# Patient Record
Sex: Male | Born: 1955 | Race: White | Hispanic: No | Marital: Married | Smoking: Never smoker
Health system: Southern US, Community
[De-identification: ages and names within clinical notes are randomized; demographics above are authoritative.]

## PROBLEM LIST (undated history)

## (undated) DIAGNOSIS — F419 Anxiety disorder, unspecified: Secondary | ICD-10-CM

## (undated) DIAGNOSIS — F329 Major depressive disorder, single episode, unspecified: Secondary | ICD-10-CM

## (undated) DIAGNOSIS — F32A Depression, unspecified: Secondary | ICD-10-CM

## (undated) HISTORY — PX: ACHILLES TENDON REPAIR: SUR1153

## (undated) HISTORY — PX: VASECTOMY: SHX75

---
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
## 2007-01-16 ENCOUNTER — Ambulatory Visit (HOSPITAL_COMMUNITY): Admission: RE | Admit: 2007-01-16 | Discharge: 2007-01-16 | Payer: Self-pay | Admitting: Gastroenterology

## 2009-06-23 ENCOUNTER — Emergency Department (HOSPITAL_COMMUNITY): Admission: EM | Admit: 2009-06-23 | Discharge: 2009-06-23 | Payer: Self-pay | Admitting: Emergency Medicine

## 2010-09-15 NOTE — Op Note (Signed)
NAMEJARAD, BARTH               ACCOUNT NO.:  [id number]   MEDICAL RECORD NO.:  [id number]          PATIENT TYPE:  AMB   LOCATION:  ENDO                         FACILITY[facility]  [facility]   PHYSICIAN:  Shirley Friar, MDDATE OF BIRTH:  01-25-1956   DATE OF PROCEDURE:  01/16/2007  DATE OF DISCHARGE:                               OPERATIVE REPORT   OPERATION/PROCEDURE:  Colonoscopy.   INDICATIONS:  Heme-positive stools.   MEDICATIONS:  Fentanyl 100 mcg IV, Versed 6 mg IV   FINDINGS:  Rectal exam was normal.  A pediatric Pentax colonoscope was  inserted through a well-prepped colon, advanced to the cecum where  ileocecal valve and appendiceal orifice were identified.  On careful  withdrawal of the colonoscope, no mucosal abnormalities were seen.  Retroflexion revealed minimal internal hemorrhoids.  Minimal internal hemorrhoids;  otherwise normal colonoscopy.   PLAN:  Repeat colonoscopy in 5 years.  If the patient has anemia that  persists, then may need to consider further evaluation.      Shirley Friar, MD  Electronically Signed     VCS/MEDQ  D:  01/16/2007  T:  01/17/2007  Job:  [id number]

## 2015-04-14 ENCOUNTER — Ambulatory Visit (INDEPENDENT_AMBULATORY_CARE_PROVIDER_SITE_OTHER): Payer: 59 | Admitting: Sports Medicine

## 2015-04-14 ENCOUNTER — Encounter: Payer: Self-pay | Admitting: Sports Medicine

## 2015-04-14 VITALS — BP 120/74 | Ht 69.0 in | Wt 185.0 lb

## 2015-04-14 DIAGNOSIS — M25512 Pain in left shoulder: Secondary | ICD-10-CM

## 2015-04-14 DIAGNOSIS — M7502 Adhesive capsulitis of left shoulder: Secondary | ICD-10-CM | POA: Diagnosis not present

## 2015-04-14 NOTE — Patient Instructions (Signed)
Adhesive Capsulitis Adhesive capsulitis is inflammation of the tendons and ligaments that surround the shoulder joint (shoulder capsule). This condition causes the shoulder to become stiff and painful to move. Adhesive capsulitis is also called frozen shoulder. CAUSES This condition may be caused by:  An injury to the shoulder joint.  Straining the shoulder.  Not moving the shoulder for a period of time. This can happen if your arm was injured or in a sling.  Long-standing health problems, such as:  Diabetes.  Thyroid problems.  Heart disease.  Stroke.  Rheumatoid arthritis.  Lung disease. In some cases, the cause may not be known. RISK FACTORS This condition is more likely to develop in:  Women.  People who are older than 57 years of age. SYMPTOMS Symptoms of this condition include:  Pain in the shoulder when moving the arm. There may also be pain when parts of the shoulder are touched. The pain is worse at night or when at rest.  Soreness or aching in the shoulder.  Inability to move the shoulder normally.  Muscle spasms. DIAGNOSIS This condition is diagnosed with a physical exam and imaging tests, such as an X-ray or MRI. TREATMENT This condition may be treated with:  Treatment of the underlying cause or condition.  Physical therapy. This involves performing exercises to get the shoulder moving again.  Medicine. Medicine may be given to relieve pain, inflammation, or muscle spasms.  Steroid injections into the shoulder joint.  Shoulder manipulation. This is a procedure to move the shoulder into another position. It is done after you are given a medicine to make you fall asleep (general anesthetic). The joint may also be injected with salt water at high pressure to break down scarring.  Surgery. This may be done in severe cases when other treatments have failed. Although most people recover completely from adhesive capsulitis, some may not regain the full  movement of the shoulder. HOME CARE INSTRUCTIONS  Take over-the-counter and prescription medicines only as told by your health care provider.  If you are being treated with physical therapy, follow instructions from your physical therapist.  Avoid exercises that put a lot of demand on your shoulder, such as throwing. These exercises can make pain worse.  If directed, apply ice to the injured area:  Put ice in a plastic bag.  Place a towel between your skin and the bag.  Leave the ice on for 20 minutes, 2-3 times per day. SEEK MEDICAL CARE IF:  You develop new symptoms.  Your symptoms get worse.   This information is not intended to replace advice given to you by your health care provider. Make sure you discuss any questions you have with your health care provider.   Document Released: 02/14/2009 Document Revised: 01/08/2015 Document Reviewed: 08/12/2014 Elsevier Interactive Patient Education 2016 [employer].  

## 2015-04-14 NOTE — Progress Notes (Signed)
   Subjective:    Patient ID: Benjamin Nichols, male    DOB: February 28, 1956, 59 y.o.   MRN: [id number]  HPI Benjamin Nichols is a 59yo male presenting today for left shoulder pain. - Notes pain since September 2016 - Pain located of left lateral and posterior shoulder - Describes pain as stabbing, ice pick sensation - Notes increased stiffness in the morning - Denies history of trauma or injury - Notes decreased ROM of abduction and external rotation. States this makes it difficult to do certain things, such as put on a coat. - Has tried Advil and Meloxicam without relief. Notes some relief with Biofreeze. - Denies swelling, redness - Right hand dominant  Review of Systems Per HPI    Objective:   Physical Exam General: 59yo male resting comfortably in no apparent distress Cardiac: Upper extremities warm and well perfused Resp: No increased work of breathing noted Musc: Shoulders symmetric bilaterally without edema, no tenderness to palpation noted, passive and active ROM of left shoulder significantly diminished in left shoulder (abduction 70 degrees, internal rotation to L2, external rotation 50 degrees), muscle strength 5/5 in upper extremity with intact rotator cuff     Assessment & Plan:  # Adhesive Capsulitis: Decreased passive and active ROM of left shoulder on exam. Effecting activities of daily living, such as dressing. Oral medications have not been effective. Differential diagnosis includes arthritis of left shoulder.  Plan: - Will obtain AP and axillary views of left shoulder - Follow up Thursday (04/17/15) to discuss xray results. Consider injection under ultrasound guidance at that time.  Patient seen and evaluated with the resident. I agree with the above plan of care. Patient has severely limited range of motion in all planes both actively and passively including passive external rotation. These findings are consistent with adhesive capsulitis. Rotator cuff strength is 5/5 and  does not reproduce pain. I would like for the patient to get some x-rays of his shoulder to rule out glenohumeral osteoarthritis and follow-up with [place] later this week for an ultrasound guided intra-articular injection. I may consider amitriptyline as well.

## 2015-04-15 ENCOUNTER — Ambulatory Visit (HOSPITAL_COMMUNITY)
Admission: RE | Admit: 2015-04-15 | Discharge: 2015-04-15 | Disposition: A | Discharge status: Home / Self Care | Payer: 59 | Source: Ambulatory Visit | Attending: Sports Medicine | Admitting: Sports Medicine

## 2015-04-15 ENCOUNTER — Ambulatory Visit: Payer: Self-pay | Admitting: Sports Medicine

## 2015-04-15 DIAGNOSIS — M7592 Shoulder lesion, unspecified, left shoulder: Secondary | ICD-10-CM | POA: Diagnosis not present

## 2015-04-15 DIAGNOSIS — M25512 Pain in left shoulder: Secondary | ICD-10-CM | POA: Insufficient documentation

## 2015-04-17 ENCOUNTER — Encounter: Payer: Self-pay | Admitting: Sports Medicine

## 2015-04-17 ENCOUNTER — Ambulatory Visit (INDEPENDENT_AMBULATORY_CARE_PROVIDER_SITE_OTHER): Payer: 59 | Admitting: Sports Medicine

## 2015-04-17 VITALS — BP 100/64 | Ht 69.0 in | Wt 185.0 lb

## 2015-04-17 DIAGNOSIS — M7502 Adhesive capsulitis of left shoulder: Secondary | ICD-10-CM | POA: Diagnosis not present

## 2015-04-17 DIAGNOSIS — M25512 Pain in left shoulder: Secondary | ICD-10-CM | POA: Diagnosis not present

## 2015-04-17 MED ORDER — METHYLPREDNISOLONE ACETATE 40 MG/ML IJ SUSP
40.0000 mg | Freq: Once | INTRAMUSCULAR | Status: AC
  Administered 2015-04-17: 40 mg via INTRA_ARTICULAR

## 2015-04-17 NOTE — Assessment & Plan Note (Signed)
Most likely component of adhesive capsulitis of his left shoulder. -Intra-articular distention/injection today and we'll refer to physical therapy. -Follow-up in 6-8 weeks to see how he is doing at that time.   Aspiration/Injection Procedure Note ASHKAN ANNUNZIATO 04-Oct-1955  Procedure: Injection Indications: Adhesive capsulitis   Procedure Details Consent: Risks of procedure as well as the alternatives and risks of each were explained to the (patient/caregiver).  Consent for procedure obtained. Time Out: Verified patient identification, verified procedure, site/side was marked, verified correct patient position, special equipment/implants available, medications/allergies/relevent history reviewed, required imaging and test results available.  Performed.  The area was cleaned with iodine and alcohol swabs.    The L intraarticular shoulder joint was injected using 2 cc's of 40mg  Depomedrol and 8 cc's of 1% lidocaine with a 21 1 1/2" needle.  Ultrasound was used. Images were obtained in Transverse and Long views showing the injection.    A sterile dressing was applied.  Patient did tolerate procedure well. Estimated blood loss: None

## 2015-04-17 NOTE — Progress Notes (Signed)
  Benjamin Nichols - 59 y.o. male MRN [id number]  Date of birth: May 21, 1955  SUBJECTIVE:  Including CC & ROS.  Benjamin Nichols is a 59 y.o. male who presents today for left shoulder pain.    Shoulder Pain left, initial visit - patient presents today for ongoing left shoulder pain for the past 6-7 months now. Pain was initially severe and limited his range of motion and function at that time. It has progressed to the point that now his range of motion is the only thing that is limited especially in abduction and external rotation. He does notice pain with occasionally picking up objects. He denies any paresthesias going down either arm and he is right hand dominant. No previous injury to either shoulder. He has tried NSAIDs and ice with minimal relief to date. It does awaken him at night and does not notice it overhead motion really limits his activity.    PMHx - Updated and reviewed.  Contributory factors include: Noncontributory PSHx - Updated and reviewed.  Contributory factors include:  Noncontributory FHx - Updated and reviewed.  Contributory factors include:  Noncontributory Medications - updated and reviewed   ROS Per HPI   PE: Filed Vitals:   04/17/15 0903  BP: 100/64   Gen: NAD Cardiorespiratory - Normal respiratory effort/rate.  RRR  MSK Shoulder:  L Shoulder: Inspection reveals no abnormalities, atrophy or asymmetry. Palpation is normal with no tenderness over AC joint or bicipital groove. ROM - 150 degrees flexion, 60 degrees extension, 100 abduction, 60 ER/ 90 IR  Rotator cuff strength normal throughout. No signs of impingement with negative Neer and Hawkin's tests, empty can. Speeds and Yergason's tests normal. No labral pathology noted with negative Obrien's, negative crank, negative anterior glide, negative compression/rotation and good stability. Normal scapular function observed. No painful arc and no drop arm sign. No apprehension sign and no Jobe relocation sign    Negative Cross arm maneuver against resistance   Neurovascular status - Intact B/L UE

## 2015-05-06 ENCOUNTER — Ambulatory Visit: Payer: 59 | Attending: Sports Medicine | Admitting: Physical Therapy

## 2015-05-06 DIAGNOSIS — M6281 Muscle weakness (generalized): Secondary | ICD-10-CM | POA: Insufficient documentation

## 2015-05-06 DIAGNOSIS — R531 Weakness: Secondary | ICD-10-CM

## 2015-05-06 DIAGNOSIS — M7582 Other shoulder lesions, left shoulder: Secondary | ICD-10-CM | POA: Diagnosis not present

## 2015-05-06 DIAGNOSIS — M25612 Stiffness of left shoulder, not elsewhere classified: Secondary | ICD-10-CM

## 2015-05-06 DIAGNOSIS — M25512 Pain in left shoulder: Secondary | ICD-10-CM | POA: Diagnosis not present

## 2015-05-06 NOTE — Therapy (Signed)
[place], [place], [address] Phone: [phone]   Fax:  [phone]  Physical Therapy Evaluation  Patient Details  Name: Benjamin Nichols MRN: [id number] Date of Birth: 1955-08-06 Referring Provider: Thurman Coyer, DO  Encounter Date: 05/06/2015      PT End of Session - 05/06/15 1355    Visit Number 1   Number of Visits 8   Date for PT Re-Evaluation 07/01/15   PT Start Time 0936   PT Stop Time 1025   PT Time Calculation (min) 49 min   Activity Tolerance Patient tolerated treatment well   Behavior During Therapy [facility] for tasks assessed/performed      History reviewed. No pertinent past medical history.  History reviewed. No pertinent past surgical history.  There were no vitals filed for this visit.  Visit Diagnosis:  Decreased range of motion of shoulder, left - Plan: PT plan of care cert/re-cert  Decreased strength - Plan: PT plan of care cert/re-cert  Pain in joint of left shoulder - Plan: PT plan of care cert/re-cert      Subjective Assessment - 05/06/15 0942    Subjective Noticed increased pain and decreased range of motion in the Lt shoulder in September and gradual progressed. Diagnosed with adhesive capsulitis and referred to OPPT. Did have cortisone injection 2 weeks ago.    Limitations House hold activities;Lifting   How long can you sit comfortably? unlimited   How long can you stand comfortably? unlimited    How long can you walk comfortably? unlimited   Patient Stated Goals Be able to move are more and have less pain.    Currently in Pain? No/denies  no pain while at rest, will increase with motion and stretching            [facility] PT Assessment - 05/06/15 0001    Assessment   Medical Diagnosis Left shoulder pain   Referring Provider Thurman Coyer, DO   Onset Date/Surgical Date --  September   Next MD Visit about a month from now   Prior Therapy none   Precautions   Precautions None   Restrictions   Weight Bearing Restrictions No   Balance Screen   Has the patient fallen in the past 6 months No   [place] residence   Living Arrangements Spouse/significant other   Prior Function   Level of Independence Independent   Vocation Full time employment   Vocation Requirements sitting and computer time, able to get up walking around   Cognition   Overall Cognitive Status Within Functional Limits for tasks assessed   Observation/Other Assessments   Focus on Therapeutic Outcomes (FOTO)  39% limitation   Sensation   Light Touch Appears Intact   Posture/Postural Control   Posture/Postural Control No significant limitations   AROM   Right Shoulder Flexion 160 Degrees   Right Shoulder Internal Rotation --  mid thoracic   Right Shoulder External Rotation 69 Degrees   Left Shoulder Flexion 124 Degrees   Left Shoulder Internal Rotation --  gluteal   Left Shoulder External Rotation 26 Degrees   Strength   Left Shoulder Flexion 4+/5   Left Shoulder Internal Rotation 4+/5   Left Shoulder External Rotation 4+/5                           PT Education - 05/06/15 1355    Education provided Yes  Education Details HEP, adhesive capsulitis anatomy   Person(s) Educated Patient   Methods Explanation;Demonstration;Tactile cues;Verbal cues;Handout   Comprehension Verbalized understanding;Returned demonstration          PT Short Term Goals - 05/06/15 1407    PT SHORT TERM GOAL #1   Title Patient is to be independent with a HEP for initial stretches.    Time 4   Period Weeks   Status New   PT SHORT TERM GOAL #2   Title Patient is to demo 140 degrees of Lt shoulder flexion for reaching tasks.    Time 4   Period Weeks   Status New   PT SHORT TERM GOAL #3   Title Patient is to demo Lt shoulder IR to lumbar level for donning a coat.    Time 4   Period Weeks   Status New           PT Long  Term Goals - 05/06/15 1408    PT LONG TERM GOAL #1   Title Patient is to be independent with a HEP for ROM and strengthening exercises.    Time 8   Period Weeks   Status New   PT LONG TERM GOAL #2   Title Patient is to demon greater to or equal to 160 degrees of Lt shoulder flexion for reaching to top shelves.    Time 8   Period Weeks   Status New   PT LONG TERM GOAL #3   Title Patient is to have internal rotation to low throacic level for dressing.    Time 8   Period Weeks   Status New   PT LONG TERM GOAL #4   Title Patient to rate his pain as less than 3/10 with activity for increased activity tolerance.    Time 8   Period Weeks   Status New   PT LONG TERM GOAL #5   Title Patient is to demo 5/5 strenght with lt shoulder flexion for lifting tasks.    Time 8   Period Weeks               Plan - 05/06/15 1358    Clinical Impression Statement Patient is a 60 y.o. male who is referred to OPPT with a diagnosis of Lt shoulder pain. He describes that he has been diagnosed with adhesive capsulitis and received an injection which has seemed to help. At this time the patient is appropriate for ongoing PT sessions to address deficits in ROM and strength through the Lt  UE. The plan was discussed and he agrees to continue. The patient did request starting with 1Xweek at this time.    Pt will benefit from skilled therapeutic intervention in order to improve on the following deficits Decreased strength;Decreased activity tolerance;Impaired flexibility;Decreased range of motion;Pain   PT Frequency 1x / week   PT Duration 8 weeks   PT Treatment/Interventions ADLs/Self Care Home Management;Cryotherapy;Electrical Stimulation;Iontophoresis 4mg /ml Dexamethasone;Moist Heat;Ultrasound;Patient/family education;Therapeutic exercise;Therapeutic activities;Manual techniques;Passive range of motion;Dry needling;Taping   PT Next Visit Plan joint mobilizations and endrange stretches for Lt shoulder.     PT Home Exercise Plan review stretches, add external rotations stretch and modify current HEP as needed.    Consulted and Agree with Plan of Care Patient         Problem List Patient Active Problem List   Diagnosis Date Noted  . Adhesive capsulitis of left shoulder 04/17/2015    Linard Millers, PT 05/06/2015, 2:17 PM  [facility] [address]  [place], [place], [address] Phone: [phone]   Fax:  [phone]  Name: Benjamin Nichols MRN: [id number] Date of Birth: 1955-11-29

## 2015-05-13 ENCOUNTER — Ambulatory Visit: Payer: 59 | Admitting: Physical Therapy

## 2015-05-13 DIAGNOSIS — M6281 Muscle weakness (generalized): Secondary | ICD-10-CM | POA: Diagnosis not present

## 2015-05-13 DIAGNOSIS — M7582 Other shoulder lesions, left shoulder: Secondary | ICD-10-CM | POA: Diagnosis not present

## 2015-05-13 DIAGNOSIS — M25612 Stiffness of left shoulder, not elsewhere classified: Secondary | ICD-10-CM

## 2015-05-13 DIAGNOSIS — M25512 Pain in left shoulder: Secondary | ICD-10-CM | POA: Diagnosis not present

## 2015-05-13 DIAGNOSIS — R531 Weakness: Secondary | ICD-10-CM

## 2015-05-13 NOTE — Patient Instructions (Signed)
Pectoral Stretch    With arms behind doorjamb, gently lean forward. Stretch is felt across chest. Hold 30___ seconds. Repeat _3__ times. Do _1__ sessions per day. http://gt2.exer.us/32   Copyright  VHI. All rights reserved.  Axial Extension (Chin Tuck)    Pull chin in and lengthen back of neck. Hold __5_ seconds while counting out loud. Repeat ___5_ times. Do __PRN__ sessions per day.  http://gt2.exer.us/449   Copyright  VHI. All rights reserved.  Shoulder (Scapula) Retraction    Pull shoulders back, squeezing shoulder blades together. Repeat ____ times per session. Do ____ sessions per week. Position: Standing   Copyright  VHI. All rights reserved.

## 2015-05-13 NOTE — Therapy (Signed)
[place], [place], [address] Phone: [phone]   Fax:  [phone]  Physical Therapy Treatment  Patient Details  Name: THAI BURGUENO MRN: [id number] Date of Birth: 1955/11/17 Referring Provider: Thurman Coyer, DO  Encounter Date: 05/13/2015      PT End of Session - 05/13/15 1358    Visit Number 2   Number of Visits 8   Date for PT Re-Evaluation 07/01/15   PT Start Time 0930   PT Stop Time 1015   PT Time Calculation (min) 45 min   Activity Tolerance Patient tolerated treatment well   Behavior During Therapy [facility] for tasks assessed/performed      No past medical history on file.  No past surgical history on file.  There were no vitals filed for this visit.  Visit Diagnosis:  Decreased range of motion of shoulder, left  Decreased strength  Pain in joint of left shoulder      Subjective Assessment - 05/13/15 0935    Subjective (p) Stiff first thin in am.     Currently in Pain? (p) Yes   Pain Score (p) 9    Pain Orientation (p) Left                         [facility] Adult PT Treatment/Exercise - 05/13/15 0940    Self-Care   Self-Care --  education posture sitting handout issued, practiced   Neck Exercises: Seated   Neck Retraction 5 reps;5 secs  added to home   Shoulder Exercises: Supine   External Rotation 5 reps  fists on forehead, with joint distraction.  mobilization.     Flexion Limitations AAROM 5 Xfingers laced.   Other Supine Exercises serratus punches.  10 X   Shoulder Exercises: Seated   External Rotation AROM;10 reps  for posture stretch intermittant   Shoulder Exercises: Prone   Extension 10 reps  scapular movement first then moves arm into extension   Other Prone Exercises row: scapular motions first  then arm motion into row.     Shoulder Exercises: Standing   External Rotation 10 reps   Theraband Level (Shoulder External Rotation) Level 3 (Green)   added to home   Internal Rotation 10 reps   Theraband Level (Shoulder Internal Rotation) Level 3 (Green)   Row 10 reps   Theraband Level (Shoulder Row) Level 3 (Green)  added to home   Shoulder Exercises: IT sales professional 3 reps;30 seconds   Corner Stretch Limitations Doorway, added to home   Other Shoulder Stretches sleeper stretch practice 1 X correct technique used   Other Shoulder Stretches IR self mobilization with towel roll 5 X 10 seconds.  Cues   Manual Therapy   Manual therapy comments joint mobilization inferior capsule,  posterior , teres non tender                PT Education - 05/13/15 1357    Education provided Yes   Education Details exercise, posture   Person(s) Educated Patient   Methods Explanation;Demonstration;Tactile cues;Verbal cues;Handout   Comprehension Verbalized understanding;Returned demonstration          PT Short Term Goals - 05/13/15 1401    PT SHORT TERM GOAL #1   Title Patient is to be independent with a HEP for initial stretches.    Baseline cues   Time 4   Period Weeks   Status On-going   PT SHORT TERM GOAL #  2   Title Patient is to demo 140 degrees of Lt shoulder flexion for reaching tasks.    Time 4   Period Weeks   Status On-going   PT SHORT TERM GOAL #3   Title Patient is to demo Lt shoulder IR to lumbar level for donning a coat.    Time 4   Period Weeks   Status Achieved           PT Long Term Goals - 05/13/15 1403    PT LONG TERM GOAL #1   Title Patient is to be independent with a HEP for ROM and strengthening exercises.    Time 8   Period Weeks   Status On-going   PT LONG TERM GOAL #2   Title Patient is to demon greater to or equal to 160 degrees of Lt shoulder flexion for reaching to top shelves.    Time 8   Period Weeks   Status On-going   PT LONG TERM GOAL #3   Title Patient is to have internal rotation to low throacic level for dressing.    Time 8   Period Weeks   Status On-going   PT LONG  TERM GOAL #4   Title Patient to rate his pain as less than 3/10 with activity for increased activity tolerance.    Baseline 3/10   Time 8   Period Weeks   Status Achieved   PT LONG TERM GOAL #5   Title Patient is to demo 5/5 strenght with lt shoulder flexion for lifting tasks.    Time 8   Period Weeks   Status On-going               Plan - 05/13/15 1358    Clinical Impression Statement Progress toward range and pain goals.  He has decided to return in 2 weeks for re check and exercise progression vs making today be his last day.  Progress toward exercise goal ..STG # 3 met.   PT Next Visit Plan joint mobilizations and endrange stretches for Lt shoulder.    PT Home Exercise Plan row, neck retraction, IR/ER/ bands  and active, doorway stretch   Consulted and Agree with Plan of Care Patient        Problem List Patient Active Problem List   Diagnosis Date Noted  . Adhesive capsulitis of left shoulder 04/17/2015    [facility] 05/13/2015, 2:04 PM  [facility] [address] [place], [place], [address] Phone: [phone]   Fax:  [phone]  Name: Benjamin Nichols MRN: [id number] Date of Birth: Nov 17, 1955    Melvenia Needles, PTA 05/13/2015 2:04 PM Phone: [phone] Fax: [phone]

## 2015-05-20 ENCOUNTER — Ambulatory Visit (INDEPENDENT_AMBULATORY_CARE_PROVIDER_SITE_OTHER): Payer: 59 | Admitting: Sports Medicine

## 2015-05-20 ENCOUNTER — Encounter: Payer: Self-pay | Admitting: Sports Medicine

## 2015-05-20 VITALS — BP 108/68 | Ht 69.0 in | Wt 185.0 lb

## 2015-05-20 DIAGNOSIS — M7502 Adhesive capsulitis of left shoulder: Secondary | ICD-10-CM

## 2015-05-20 NOTE — Progress Notes (Signed)
   Subjective:    Patient ID: Benjamin Nichols, male    DOB: 04/09/1956, 60 y.o.   MRN: [id number]  HPI   Patient comes in today for follow-up on left shoulder adhesive capsulitis. He is doing well. He feels like he has made about 50% improvement since his last office visit. He underwent a combination subacromial/intra-articular cortisone injection, which was ultrasound-guided, one month ago. He has been working in physical therapy since then. Pain has improved but not completely resolved. Range of motion is also improving but has not returned to normal. He is taking 2 Advil twice a day but otherwise is not taking any other sort of pain medication.    Review of Systems     Objective:   Physical Exam Well-developed, well-nourished. No acute distress  Left shoulder: Active forward flexion is 130. Active abduction is 110 degrees. Active internal rotation is 70. Active external rotation is 60. Passive range of motion is identical to active range of motion. Slight pain with active and passive range of motion but no significant cuff weakness. Neurovascularly intact distally.       Assessment & Plan:  Improving left shoulder pain secondary to adhesive capsulitis/subacromial bursitis/rotator cuff impingement  Patient is making good progress. I've encouraged him to continue working with formal physical therapy and follow-up with me in another 4 weeks. As long as he continues to progress, I think I can hold on further diagnostic imaging. However, if his symptoms plateau or worsen then we could consider a possible MRI or referral to Dr. Mardelle Matte.

## 2015-05-22 MED FILL — CIALIS 5 MG TABLET: 5 | 30 days supply | Qty: 30 | Fill #2

## 2015-05-27 ENCOUNTER — Ambulatory Visit: Payer: 59 | Admitting: Physical Therapy

## 2015-05-27 DIAGNOSIS — M25612 Stiffness of left shoulder, not elsewhere classified: Secondary | ICD-10-CM

## 2015-05-27 DIAGNOSIS — R531 Weakness: Secondary | ICD-10-CM

## 2015-05-27 DIAGNOSIS — M25512 Pain in left shoulder: Secondary | ICD-10-CM

## 2015-05-27 DIAGNOSIS — M6281 Muscle weakness (generalized): Secondary | ICD-10-CM | POA: Diagnosis not present

## 2015-05-27 DIAGNOSIS — M7582 Other shoulder lesions, left shoulder: Secondary | ICD-10-CM | POA: Diagnosis not present

## 2015-05-28 NOTE — Therapy (Signed)
[place], [place], [address] Phone: [phone]   Fax:  [phone]  Physical Therapy Treatment  Patient Details  Name: Benjamin Nichols MRN: [id number] Date of Birth: [birth year] Referring Provider: Thurman Coyer, DO  Encounter Date: 05/27/2015      PT End of Session - 05/27/15 1458    Visit Number 3   Number of Visits 8   Date for PT Re-Evaluation 07/01/15    PT start time: 6440 PT end time" : 0930 45 minutes Tolerated treatment well   No past medical history on file.  No past surgical history on file.  There were no vitals filed for this visit.  Visit Diagnosis:  Decreased range of motion of shoulder, left  Decreased strength  Pain in joint of left shoulder                       [facility] Adult PT Treatment/Exercise - 05/27/15 0921    Shoulder Exercises: Sidelying   External Rotation 10 reps  2 sets 0 LBS, with towel roll.   Flexion 10 reps  UE ranger used   ABduction 10 reps   Shoulder Exercises: Standing   Other Standing Exercises UE ranger for IR 10 X  Other exercises doorway er stretch 3 X 30    Manual Therapy   Manual Therapy Joint mobilization;Soft tissue mobilization;Myofascial release;Scapular mobilization;Passive ROM   Manual therapy comments grade 11 joint mobilization, myofascial release LT     Plan.  No pain post session.  Shoulder feels looser with increased range.  Manual helpful.  No new goals met Next visit: Measure,               PT Short Term Goals - 05/13/15 1401    PT SHORT TERM GOAL #1   Title Patient is to be independent with a HEP for initial stretches.    Baseline cues   Time 4   Period Weeks   Status On-going   PT SHORT TERM GOAL #2   Title Patient is to demo 140 degrees of Lt shoulder flexion for reaching tasks.    Time 4   Period Weeks   Status On-going   PT SHORT TERM GOAL #3   Title Patient is to demo Lt shoulder IR to  lumbar level for donning a coat.    Time 4   Period Weeks   Status Achieved           PT Long Term Goals - 05/13/15 1403    PT LONG TERM GOAL #1   Title Patient is to be independent with a HEP for ROM and strengthening exercises.    Time 8   Period Weeks   Status On-going   PT LONG TERM GOAL #2   Title Patient is to demon greater to or equal to 160 degrees of Lt shoulder flexion for reaching to top shelves.    Time 8   Period Weeks   Status On-going   PT LONG TERM GOAL #3   Title Patient is to have internal rotation to low throacic level for dressing.    Time 8   Period Weeks   Status On-going   PT LONG TERM GOAL #4   Title Patient to rate his pain as less than 3/10 with activity for increased activity tolerance.    Baseline 3/10   Time 8   Period Weeks   Status Achieved   PT LONG TERM GOAL #5  Title Patient is to demo 5/5 strenght with lt shoulder flexion for lifting tasks.    Time 8   Period Weeks   Status On-going               Problem List Patient Active Problem List   Diagnosis Date Noted  . Adhesive capsulitis of left shoulder 04/17/2015    [facility] 05/28/2015, 1:00 PM  [facility] [address] [place], [place], [address] Phone: [phone]   Fax:  [phone]  Name: ALAZAR CHERIAN MRN: [id number] Date of Birth: March 15, 1956    Melvenia Needles, PTA 05/28/2015 1:00 PM Phone: [phone] Fax: [phone]

## 2015-06-03 ENCOUNTER — Ambulatory Visit: Payer: 59 | Admitting: Physical Therapy

## 2015-06-03 DIAGNOSIS — R531 Weakness: Secondary | ICD-10-CM

## 2015-06-03 DIAGNOSIS — M25612 Stiffness of left shoulder, not elsewhere classified: Secondary | ICD-10-CM

## 2015-06-03 DIAGNOSIS — M25512 Pain in left shoulder: Secondary | ICD-10-CM

## 2015-06-03 DIAGNOSIS — M6281 Muscle weakness (generalized): Secondary | ICD-10-CM | POA: Diagnosis not present

## 2015-06-03 DIAGNOSIS — M7582 Other shoulder lesions, left shoulder: Secondary | ICD-10-CM | POA: Diagnosis not present

## 2015-06-03 NOTE — Therapy (Signed)
[place], [place], [address] Phone: [phone]   Fax:  [phone]  Physical Therapy Treatment  Patient Details  Name: Benjamin Nichols MRN: [id number] Date of Birth: 11/02/1955 Referring Provider: Thurman Coyer, DO  Encounter Date: 06/03/2015      PT End of Session - 06/03/15 1039    Visit Number 4   Number of Visits 8   Date for PT Re-Evaluation 07/01/15   PT Start Time 0845   PT Stop Time 0930   PT Time Calculation (min) 45 min      No past medical history on file.  No past surgical history on file.  There were no vitals filed for this visit.  Visit Diagnosis:  Decreased range of motion of shoulder, left  Decreased strength  Pain in joint of left shoulder      Subjective Assessment - 06/03/15 0849    Subjective No pain. Only twinge with end range- have to push to make hurt.    Currently in Pain? No/denies            [facility] PT Assessment - 06/03/15 0855    AROM   Left Shoulder Flexion 145 Degrees   Left Shoulder Internal Rotation --  Lumbar 1   Left Shoulder External Rotation 34 Degrees   Strength   Left Shoulder Flexion 4+/5                     [facility] Adult PT Treatment/Exercise - 06/03/15 0001    Shoulder Exercises: Standing   Other Standing Exercises UE ranger for IR  25- able to reach thoracic spine after   Shoulder Exercises: Stretch   Corner Stretch 3 reps;30 seconds   Corner Stretch Limitations Doorway, added to home   Wall Stretch - Flexion 2 reps;30 seconds   Wall Stretch - Flexion Limitations then abduction 2 reps 30 secs   Other Shoulder Stretches supine pec stretch    Other Shoulder Stretches IR AAROM stretch 3 x 30 sec   Manual Therapy   Manual Therapy Passive ROM   Manual therapy comments grade 2,3 inferior glides and AP glides as well as prone PA glides   Passive ROM Followed by PROM flex abdct, ER, IR                  PT Short  Term Goals - 06/03/15 0850    PT SHORT TERM GOAL #1   Title Patient is to be independent with a HEP for initial stretches.    Time 4   Period Weeks   Status Achieved   PT SHORT TERM GOAL #2   Title Patient is to demo 140 degrees of Lt shoulder flexion for reaching tasks.    Time 4   Period Weeks   Status Achieved   PT SHORT TERM GOAL #3   Title Patient is to demo Lt shoulder IR to lumbar level for donning a coat.    Time 4   Period Weeks   Status Achieved           PT Long Term Goals - 06/03/15 9357    PT LONG TERM GOAL #1   Title Patient is to be independent with a HEP for ROM and strengthening exercises.    Time 8   Period Weeks   Status On-going   PT LONG TERM GOAL #2   Title Patient is to demon greater to or equal to 160 degrees of Lt shoulder  flexion for reaching to top shelves.    Time 8   Period Weeks   Status On-going   PT LONG TERM GOAL #3   Title Patient is to have internal rotation to low throacic level for dressing.    Time 8   Period Weeks   Status Partially Met   PT LONG TERM GOAL #4   Title Patient to rate his pain as less than 3/10 with activity for increased activity tolerance.    Time 8   Period Weeks   Status Achieved   PT LONG TERM GOAL #5   Title Patient is to demo 5/5 strenght with lt shoulder flexion for lifting tasks.    Time 8   Period Weeks   Status On-going               Plan - 06/03/15 1040    Clinical Impression Statement AROM improved therefore all STGs achieved. Pt reports significant improvement in pain and it requires alot of effort to reproduce his pain. Worked toward LTGs using joint mobilizations followed by PROM, AAROM and stretching exercises. Will assess ROM next visit and discuss renewal verses discharge to HEP for continued recovery of ROM. See goals met.    PT Next Visit Plan joint mobilizations and endrange stretches for Lt shoulder.         Problem List Patient Active Problem List   Diagnosis Date Noted   . Adhesive capsulitis of left shoulder 04/17/2015    Dorene Ar, PTA 06/03/2015, 10:45 AM  [facility] [address] [place], [place], [address] Phone: [phone]   Fax:  [phone]  Name: Benjamin Nichols MRN: [id number] Date of Birth: 11/02/1955

## 2015-06-10 ENCOUNTER — Ambulatory Visit: Payer: 59 | Attending: Sports Medicine | Admitting: Physical Therapy

## 2015-06-10 DIAGNOSIS — M25512 Pain in left shoulder: Secondary | ICD-10-CM | POA: Insufficient documentation

## 2015-06-10 DIAGNOSIS — R531 Weakness: Secondary | ICD-10-CM

## 2015-06-10 DIAGNOSIS — M25612 Stiffness of left shoulder, not elsewhere classified: Secondary | ICD-10-CM

## 2015-06-10 DIAGNOSIS — M7582 Other shoulder lesions, left shoulder: Secondary | ICD-10-CM | POA: Diagnosis not present

## 2015-06-10 DIAGNOSIS — M6281 Muscle weakness (generalized): Secondary | ICD-10-CM | POA: Insufficient documentation

## 2015-06-10 NOTE — Patient Instructions (Signed)
   Kristoffer Leamon PT, DPT, LAT, ATC  [facility] Phone: [phone]     

## 2015-06-10 NOTE — Therapy (Signed)
[place], [place], [address] Phone: [phone]   Fax:  [phone]  Physical Therapy Treatment  Patient Details  Name: Benjamin Nichols MRN: [id number] Date of Birth: May 27, 1955 Referring Provider: Thurman Coyer, DO  Encounter Date: 06/10/2015      PT End of Session - 06/10/15 1015    Visit Number 5   Number of Visits 8   Date for PT Re-Evaluation 07/01/15   PT Start Time 0845   PT Stop Time 0932   PT Time Calculation (min) 47 min   Activity Tolerance Patient tolerated treatment well   Behavior During Therapy [facility] for tasks assessed/performed      No past medical history on file.  No past surgical history on file.  There were no vitals filed for this visit.  Visit Diagnosis:  Decreased range of motion of shoulder, left  Decreased strength  Pain in joint of left shoulder      Subjective Assessment - 06/10/15 0848    Subjective "no usual pain, I can only make it hurt hwen I stretch or move the right way. it is improving"   Currently in Pain? Yes   Pain Score 1    Pain Location Shoulder   Pain Orientation Left   Pain Type Chronic pain   Aggravating Factors  reaching the wrong way            [facility] PT Assessment - 06/10/15 0001    ROM / Strength   AROM / PROM / Strength AROM   AROM   AROM Assessment Site Shoulder   Right/Left Shoulder Left   Left Shoulder Flexion 135 Degrees  150   Left Shoulder ABduction 105 Degrees  130                     [facility] Adult PT Treatment/Exercise - 06/10/15 0854    Shoulder Exercises: Supine   Other Supine Exercises thoracic extension over bolster with hand behind head 2 x 15   Shoulder Exercises: Standing   Row 10 reps   Theraband Level (Shoulder Row) Level 3 (Green)   Other Standing Exercises UE ranger for IR     Shoulder Exercises: ROM/Strengthening   UBE (Upper Arm Bike) L 1.5 x 6 min  changing direction at 3 min   Shoulder  Exercises: Stretch   Corner Stretch 3 reps;30 seconds   Corner Stretch Limitations laying parallel to bolster letting arms hang at 90 degrees and at 145 degrees  with slight overpressure    Other Shoulder Stretches rhomboid stretch 4 x 30 sec   Manual Therapy   Manual therapy comments grade 3 scapular mobs superior / inferior with upward/ downward rotation, Prone anterior humeral mobs grade 2-3 to facilitate ER                PT Education - 06/10/15 1015    Education provided Yes   Education Details updated HEP   Person(s) Educated Patient   Methods Explanation   Comprehension Verbalized understanding          PT Short Term Goals - 06/03/15 0850    PT SHORT TERM GOAL #1   Title Patient is to be independent with a HEP for initial stretches.    Time 4   Period Weeks   Status Achieved   PT SHORT TERM GOAL #2   Title Patient is to demo 140 degrees of Lt shoulder flexion for reaching tasks.    Time  4   Period Weeks   Status Achieved   PT SHORT TERM GOAL #3   Title Patient is to demo Lt shoulder IR to lumbar level for donning a coat.    Time 4   Period Weeks   Status Achieved           PT Long Term Goals - 06/10/15 1019    PT LONG TERM GOAL #1   Title Patient is to be independent with a HEP for ROM and strengthening exercises.    Time 8   Period Weeks   Status On-going   PT LONG TERM GOAL #2   Title Patient is to demon greater to or equal to 160 degrees of Lt shoulder flexion for reaching to top shelves.    Time 8   Period Weeks   Status On-going   PT LONG TERM GOAL #3   Title Patient is to have internal rotation to low throacic level for dressing.    Time 8   Period Weeks   Status Partially Met   PT LONG TERM GOAL #4   Title Patient to rate his pain as less than 3/10 with activity for increased activity tolerance.    Time 8   Period Weeks   Status Achieved   PT LONG TERM GOAL #5   Title Patient is to demo 5/5 strenght with lt shoulder flexion for  lifting tasks.    Time 8   Period Weeks   Status On-going               Plan - 06/10/15 1016    Clinical Impression Statement Benjamin Nichols reports still having some limitations with ERP with ER and abduction/ flexion. Focused mobs on scapular mobility with superior/inferior motions with upward/downward mobility which improved flexion and abduction. He was able to peform given exercises without complaint of pain and rpeorted that external rotation was easier to get without as much soreness.    PT Next Visit Plan joint mobilizations and endrange stretches for Lt shoulder.    PT Home Exercise Plan supine pec stretch on roll, rhomboid stretching, prone ER with arm haing of table.    Consulted and Agree with Plan of Care Patient        Problem List Patient Active Problem List   Diagnosis Date Noted  . Adhesive capsulitis of left shoulder 04/17/2015   Starr Lake PT, DPT, LAT, ATC  06/10/2015  10:20 AM     [facility] [address] [place], [place], [address] Phone: [phone]   Fax:  [phone]  Name: Benjamin Nichols MRN: [id number] Date of Birth: 10/30/1955

## 2015-06-17 ENCOUNTER — Ambulatory Visit: Payer: 59 | Admitting: Physical Therapy

## 2015-06-17 DIAGNOSIS — M25512 Pain in left shoulder: Secondary | ICD-10-CM | POA: Diagnosis not present

## 2015-06-17 DIAGNOSIS — R531 Weakness: Secondary | ICD-10-CM

## 2015-06-17 DIAGNOSIS — M6281 Muscle weakness (generalized): Secondary | ICD-10-CM | POA: Diagnosis not present

## 2015-06-17 DIAGNOSIS — M7582 Other shoulder lesions, left shoulder: Secondary | ICD-10-CM | POA: Diagnosis not present

## 2015-06-17 DIAGNOSIS — M25612 Stiffness of left shoulder, not elsewhere classified: Secondary | ICD-10-CM

## 2015-06-17 NOTE — Therapy (Signed)
[place], [place], [address] Phone: [phone]   Fax:  [phone]  Physical Therapy Treatment  Patient Details  Name: Benjamin Nichols MRN: [id number] Date of Birth: 1955/12/03 Referring Provider: Thurman Coyer, DO  Encounter Date: 06/17/2015      PT End of Session - 06/17/15 0849    Visit Number 6   Number of Visits 8   Date for PT Re-Evaluation 07/01/15   PT Start Time 0847   PT Stop Time 0935   PT Time Calculation (min) 48 min   Activity Tolerance Patient tolerated treatment well   Behavior During Therapy [facility] for tasks assessed/performed      No past medical history on file.  No past surgical history on file.  There were no vitals filed for this visit.  Visit Diagnosis:  Decreased range of motion of shoulder, left  Decreased strength  Pain in joint of left shoulder      Subjective Assessment - 06/17/15 0848    Subjective No pain.    Currently in Pain? No/denies   Aggravating Factors  stretch to the end point    Pain Relieving Factors change of position.             [facility] PT Assessment - 06/17/15 0001    AROM   Left Shoulder Flexion 138 Degrees  148 after TPDN   Left Shoulder ABduction 148 Degrees   Left Shoulder Internal Rotation --  Lumbar 1                     [facility] Adult PT Treatment/Exercise - 06/17/15 0001    Shoulder Exercises: ROM/Strengthening   UBE (Upper Arm Bike) L 2.0 x 6 min  changing direction at 3 min   Shoulder Exercises: Stretch   Corner Stretch 3 reps;30 seconds   Corner Stretch Limitations Doorway, added to home   Other Shoulder Stretches rhomboid stretch 4 x 30 sec   Manual Therapy   Manual Therapy Soft tissue mobilization   Manual therapy comments Prone anterior humeral mobs grade 2-3 to facilitate ER   Soft tissue mobilization Supraspinatus, infraspinatues and  teres minor IASTM   Passive ROM Followed by PROM flex abdct, ER, IR           Trigger Point Dry Needling - 06/17/15 0915    Consent Given? Yes   Education Handout Provided Yes   Muscles Treated Upper Body Infraspinatus;Supraspinatus  Teres Minor    Supraspinatus Response Twitch response elicited;Palpable increased muscle length   Infraspinatus Response Twitch response elicited;Palpable increased muscle length                PT Short Term Goals - 06/03/15 0850    PT SHORT TERM GOAL #1   Title Patient is to be independent with a HEP for initial stretches.    Time 4   Period Weeks   Status Achieved   PT SHORT TERM GOAL #2   Title Patient is to demo 140 degrees of Lt shoulder flexion for reaching tasks.    Time 4   Period Weeks   Status Achieved   PT SHORT TERM GOAL #3   Title Patient is to demo Lt shoulder IR to lumbar level for donning a coat.    Time 4   Period Weeks   Status Achieved           PT Long Term Goals - 06/10/15 1019    PT LONG TERM GOAL #  1   Title Patient is to be independent with a HEP for ROM and strengthening exercises.    Time 8   Period Weeks   Status On-going   PT LONG TERM GOAL #2   Title Patient is to demon greater to or equal to 160 degrees of Lt shoulder flexion for reaching to top shelves.    Time 8   Period Weeks   Status On-going   PT LONG TERM GOAL #3   Title Patient is to have internal rotation to low throacic level for dressing.    Time 8   Period Weeks   Status Partially Met   PT LONG TERM GOAL #4   Title Patient to rate his pain as less than 3/10 with activity for increased activity tolerance.    Time 8   Period Weeks   Status Achieved   PT LONG TERM GOAL #5   Title Patient is to demo 5/5 strenght with lt shoulder flexion for lifting tasks.    Time 8   Period Weeks   Status On-going               Plan - 06/17/15 0910    Clinical Impression Statement Improved abduction after last visit. Pt still limited flexion and ER. TPDN performed by available PT to supraspinatus,  infraspinatus, and teres minor. Flexion ROM improved post dry needling. Pt declines heat will heat at home. Scheduled pt or 2 more weeks to contiue TPDN to subscapularis and continue manual techniques to increase flexion/ ER ROM. Pt declines heat post session today and will heat and stretch at home.     PT Next Visit Plan joint mobilizations and endrange stretches for Lt shoulder. TPDN        Problem List Patient Active Problem List   Diagnosis Date Noted  . Adhesive capsulitis of left shoulder 04/17/2015   Starr Lake PT, DPT, LAT, ATC  06/17/2015  1:29 PM    [facility] [facility] [address] [place], [place], [address] Phone: [phone]   Fax:  [phone]  Name: Benjamin Nichols MRN: [id number] Date of Birth: 1956-03-28

## 2015-06-18 DIAGNOSIS — H3552 Pigmentary retinal dystrophy: Secondary | ICD-10-CM | POA: Diagnosis not present

## 2015-06-19 MED FILL — CIALIS 5 MG TABLET: 5 | 90 days supply | Qty: 90 | Fill #0

## 2015-06-24 ENCOUNTER — Ambulatory Visit: Payer: 59 | Admitting: Physical Therapy

## 2015-06-24 DIAGNOSIS — M25512 Pain in left shoulder: Secondary | ICD-10-CM

## 2015-06-24 DIAGNOSIS — M25612 Stiffness of left shoulder, not elsewhere classified: Secondary | ICD-10-CM

## 2015-06-24 DIAGNOSIS — M6281 Muscle weakness (generalized): Secondary | ICD-10-CM | POA: Diagnosis not present

## 2015-06-24 DIAGNOSIS — M7582 Other shoulder lesions, left shoulder: Secondary | ICD-10-CM | POA: Diagnosis not present

## 2015-06-24 DIAGNOSIS — R531 Weakness: Secondary | ICD-10-CM

## 2015-06-24 NOTE — Therapy (Signed)
[place], [place], [address] Phone: [phone]   Fax:  [phone]  Physical Therapy Treatment  Patient Details  Name: DEDRIC Nichols MRN: [id number] Date of Birth: 13-Oct-1955 Referring Provider: Thurman Coyer, DO  Encounter Date: 06/24/2015      PT End of Session - 06/24/15 0851    Visit Number 7   Number of Visits 8   Date for PT Re-Evaluation 07/01/15   PT Start Time 0800   PT Stop Time 0850   PT Time Calculation (min) 50 min   Activity Tolerance Patient tolerated treatment well   Behavior During Therapy [facility] for tasks assessed/performed      No past medical history on file.  No past surgical history on file.  There were no vitals filed for this visit.  Visit Diagnosis:  Decreased range of motion of shoulder, left  Decreased strength  Pain in joint of left shoulder      Subjective Assessment - 06/24/15 0800    Subjective "the needling helped with the motion and how it felt getting into the activity" pt reports no pain today.   Currently in Pain? No/denies   Pain Score 0-No pain            [facility] PT Assessment - 06/24/15 0001    AROM   Left Shoulder External Rotation 45 Degrees  55 following UBE and stretching                     [facility] Adult PT Treatment/Exercise - 06/24/15 0849    Shoulder Exercises: ROM/Strengthening   UBE (Upper Arm Bike) L 2.0 x 6 min  changing direction at 3 min   Shoulder Exercises: Stretch   Corner Stretch 3 reps;30 seconds   Corner Stretch Limitations --  laying supine   Other Shoulder Stretches rhomboid stretch 4 x 30 sec   Manual Therapy   Manual Therapy Joint mobilization   Joint Mobilization grade 3 anterior mobs to promote ER    Soft tissue mobilization instrument assisted STM over the lats, and teres major/ minor           Trigger Point Dry Needling - 06/24/15 0805    Consent Given? Yes   Education Handout Provided Yes    Muscles Treated Upper Body Subscapularis  Teres minor/ major, latissimus dorsi   Subscapularis Response Twitch response elicited;Palpable increased muscle length                PT Short Term Goals - 06/03/15 0850    PT SHORT TERM GOAL #1   Title Patient is to be independent with a HEP for initial stretches.    Time 4   Period Weeks   Status Achieved   PT SHORT TERM GOAL #2   Title Patient is to demo 140 degrees of Lt shoulder flexion for reaching tasks.    Time 4   Period Weeks   Status Achieved   PT SHORT TERM GOAL #3   Title Patient is to demo Lt shoulder IR to lumbar level for donning a coat.    Time 4   Period Weeks   Status Achieved           PT Long Term Goals - 06/10/15 1019    PT LONG TERM GOAL #1   Title Patient is to be independent with a HEP for ROM and strengthening exercises.    Time 8   Period Weeks   Status On-going  PT LONG TERM GOAL #2   Title Patient is to demon greater to or equal to 160 degrees of Lt shoulder flexion for reaching to top shelves.    Time 8   Period Weeks   Status On-going   PT LONG TERM GOAL #3   Title Patient is to have internal rotation to low throacic level for dressing.    Time 8   Period Weeks   Status Partially Met   PT LONG TERM GOAL #4   Title Patient to rate his pain as less than 3/10 with activity for increased activity tolerance.    Time 8   Period Weeks   Status Achieved   PT LONG TERM GOAL #5   Title Patient is to demo 5/5 strenght with lt shoulder flexion for lifting tasks.    Time 8   Period Weeks   Status On-going               Plan - 06/24/15 0851    Clinical Impression Statement Benjamin Nichols reports that he thinks the needling helped last time. He provided consent for dry needling of the sub-scapularis, latissimus doris, and teres major with palpable twitches and decreased tension noted during external rotation. He was able to perform stretches and UBE with no pain and improved L shoulder ER  to 55 degrees actively following todays session with PROM to 72 degrees. Next visit is his last scheduled visit, plan to assess his level of function and determine if more therapy is necessary.    PT Next Visit Plan joint mobilizations and endrange stretches for Lt shoulder. TPDN, assess ROM, Goals, FOTO, D/C Vs renewal?   PT Home Exercise Plan reviewed exercises   Consulted and Agree with Plan of Care Patient        Problem List Patient Active Problem List   Diagnosis Date Noted  . Adhesive capsulitis of left shoulder 04/17/2015   Starr Lake PT, DPT, LAT, ATC  06/24/2015  10:24 AM     [facility] [address] [place], [place], [address] Phone: [phone]   Fax:  [phone]  Name: Benjamin Nichols MRN: [id number] Date of Birth: December 08, 1955

## 2015-07-01 ENCOUNTER — Ambulatory Visit: Payer: 59 | Admitting: Physical Therapy

## 2015-07-01 ENCOUNTER — Ambulatory Visit: Payer: 59 | Admitting: Sports Medicine

## 2015-07-01 DIAGNOSIS — R531 Weakness: Secondary | ICD-10-CM

## 2015-07-01 DIAGNOSIS — M25512 Pain in left shoulder: Secondary | ICD-10-CM | POA: Diagnosis not present

## 2015-07-01 DIAGNOSIS — M7582 Other shoulder lesions, left shoulder: Secondary | ICD-10-CM | POA: Diagnosis not present

## 2015-07-01 DIAGNOSIS — M25612 Stiffness of left shoulder, not elsewhere classified: Secondary | ICD-10-CM

## 2015-07-01 DIAGNOSIS — M6281 Muscle weakness (generalized): Secondary | ICD-10-CM | POA: Diagnosis not present

## 2015-07-01 NOTE — Patient Instructions (Signed)
   2 sets x 10 reps with 2-3 pounds.

## 2015-07-01 NOTE — Therapy (Signed)
[place], [place], [address] Phone: [phone]   Fax:  [phone]  Physical Therapy Treatment / Discharge Note  Patient Details  Name: Benjamin Nichols MRN: [id number] Date of Birth: 1955/06/30 Referring Provider: Thurman Coyer, DO  Encounter Date: 07/01/2015      PT End of Session - 07/01/15 0943    Visit Number 8   Number of Visits 8   Date for PT Re-Evaluation 07/01/15   PT Start Time 0932   PT Stop Time 1010   PT Time Calculation (min) 38 min   Activity Tolerance Patient tolerated treatment well   Behavior During Therapy [facility] for tasks assessed/performed      No past medical history on file.  No past surgical history on file.  There were no vitals filed for this visit.  Visit Diagnosis:  Decreased range of motion of shoulder, left  Decreased strength  Pain in joint of left shoulder      Subjective Assessment - 07/01/15 0935    Subjective "I was more sore this week after the last session but I may have been doing more" pt reports feeling like he gets more motion after a warm up.   Currently in Pain? No/denies   Pain Location Shoulder   Pain Orientation Left            [facility] PT Assessment - 07/01/15 0950    Observation/Other Assessments   Focus on Therapeutic Outcomes (FOTO)  22% limited   AROM   Left Shoulder Flexion 148 Degrees   Left Shoulder ABduction 150 Degrees   Left Shoulder Internal Rotation 82 Degrees  t11   Left Shoulder External Rotation 54 Degrees   Strength   Left Shoulder Flexion 5/5   Left Shoulder Internal Rotation 4+/5   Left Shoulder External Rotation 4+/5                     [facility] Adult PT Treatment/Exercise - 07/01/15 0939    Self-Care   Self-Care Other Self-Care Comments   Other Self-Care Comments  to continue with exercises and progress strengthening by adding reps and sets. addressing other pt questions regarding gym exercise.    Shoulder Exercises: ROM/Strengthening   UBE (Upper Arm Bike) L 3 x 6 min  changing direction at 3 min   Shoulder Exercises: Stretch   Corner Stretch 3 reps;30 seconds   Other Shoulder Stretches rhomboid stretch 4 x 30 sec                PT Education - 07/01/15 1054    Education provided Yes   Education Details updated and reviewed HEP   Person(s) Educated Patient   Methods Explanation   Comprehension Verbalized understanding          PT Short Term Goals - 06/03/15 0850    PT SHORT TERM GOAL #1   Title Patient is to be independent with a HEP for initial stretches.    Time 4   Period Weeks   Status Achieved   PT SHORT TERM GOAL #2   Title Patient is to demo 140 degrees of Lt shoulder flexion for reaching tasks.    Time 4   Period Weeks   Status Achieved   PT SHORT TERM GOAL #3   Title Patient is to demo Lt shoulder IR to lumbar level for donning a coat.    Time 4   Period Weeks   Status Achieved  PT Long Term Goals - 07/01/15 0956    PT LONG TERM GOAL #1   Title Patient is to be independent with a HEP for ROM and strengthening exercises.    Time 8   Period Weeks   Status Achieved   PT LONG TERM GOAL #2   Title Patient is to demon greater to or equal to 160 degrees of Lt shoulder flexion for reaching to top shelves.    Baseline 148   Time 8   Period Weeks   Status Partially Met   PT LONG TERM GOAL #3   Title Patient is to have internal rotation to low throacic level for dressing.    Baseline T11    Time 8   Status Achieved   PT LONG TERM GOAL #4   Title Patient to rate his pain as less than 3/10 with activity for increased activity tolerance.    Time 8   Period Weeks   Status Achieved   PT LONG TERM GOAL #5   Title Patient is to demo 5/5 strenght with lt shoulder flexion for lifting tasks.    Time 8   Period Weeks   Status Achieved               Plan - 07/01/15 1054    Clinical Impression Statement Benjamin Nichols has made great  progress with physical therpay. He reports intermittent soreness with the L shoulder that is controlled with stretching and exercises. He has improved L shoulder AROM and strength. He met all goals except for partially meeting LTG #2. He reports he is able to maintain and progress he current level of function independently and will be discharged from physical therapy today.    PT Next Visit Plan discharged   PT Home Exercise Plan I's,T's, Y's, and reviewed HEP   Consulted and Agree with Plan of Care Patient        Problem List Patient Active Problem List   Diagnosis Date Noted  . Adhesive capsulitis of left shoulder 04/17/2015   Starr Lake PT, DPT, LAT, ATC  07/01/2015  10:58 AM     [facility] [address] [place], [place], [address] Phone: [phone]   Fax:  [phone]  Name: Benjamin Nichols MRN: [id number] Date of Birth: December 18, 1955    PHYSICAL THERAPY DISCHARGE SUMMARY  Visits from Start of Care: 8  Current functional level related to goals / functional outcomes: FOTO 22% limited   Remaining deficits: Intermittent stiffness in the L shoulder. Mild limitation in AROM compared bil but remains WFL. Weakness of the L shoulder IR/ER compared bil but WFL.   Education / Equipment: HEP, theraband for strengthening, posture education  Plan: Patient agrees to discharge.  Patient goals were partially met. Patient is being discharged due to being pleased with the current functional level.  ?????

## 2015-07-07 ENCOUNTER — Ambulatory Visit: Payer: 59 | Admitting: Sports Medicine

## 2015-07-08 DIAGNOSIS — H3552 Pigmentary retinal dystrophy: Secondary | ICD-10-CM | POA: Diagnosis not present

## 2015-07-21 MED FILL — SERTRALINE HCL 50 MG TABLET: 50 | 90 days supply | Qty: 90 | Fill #1 | Status: TO

## 2015-09-01 DIAGNOSIS — J45909 Unspecified asthma, uncomplicated: Secondary | ICD-10-CM | POA: Diagnosis not present

## 2015-09-01 MED FILL — VENTOLIN HFA 90 MCG INHALER: 108 (90 BAS | 16 days supply | Qty: 18 | Fill #0 | Status: TO

## 2015-09-01 MED FILL — predniSONE 10 MG TABS: 10 | 6 days supply | Qty: 21 | Fill #0

## 2015-09-03 MED FILL — AZITHROMYCIN 250 MG TABLET: 250 | 5 days supply | Qty: 6 | Fill #0

## 2015-09-16 MED FILL — CIALIS 5 MG TABLET: 5 | 90 days supply | Qty: 90 | Fill #1 | Status: TO

## 2015-11-17 MED FILL — SERTRALINE HCL 50 MG TABLET: 50 | 90 days supply | Qty: 90 | Fill #0

## 2015-11-20 MED FILL — MELOXICAM 15 MG TABLET: 15 | 30 days supply | Qty: 30 | Fill #0

## 2015-12-01 ENCOUNTER — Encounter: Payer: Self-pay | Admitting: Sports Medicine

## 2015-12-01 ENCOUNTER — Ambulatory Visit (INDEPENDENT_AMBULATORY_CARE_PROVIDER_SITE_OTHER): Payer: 59 | Admitting: Sports Medicine

## 2015-12-01 DIAGNOSIS — G8929 Other chronic pain: Secondary | ICD-10-CM

## 2015-12-01 DIAGNOSIS — M79675 Pain in left toe(s): Secondary | ICD-10-CM | POA: Diagnosis not present

## 2015-12-01 MED ORDER — METHYLPREDNISOLONE ACETATE 40 MG/ML IJ SUSP
40.0000 mg | Freq: Once | INTRAMUSCULAR | Status: AC
  Administered 2015-12-01: 40 mg via INTRA_ARTICULAR

## 2015-12-01 NOTE — Progress Notes (Signed)
[facility] Kerrin Mo, MD Phone: [phone]  Reason For Visit: Chronic Left Toe Pain   # Chronic left toe pain since 2004. Patient has been seen in the past by orthopedic specialist, and left toe was noted to have a bone spur on x-ray at that time. Previously controlled by Vioxx. Patient was then seen a couple years ago by his PCP for continued left toe pain and was started on Mobic following a second x-ray which again showed a bone spur. Other pain control medications that he takes included Advil. Patient's pain is most significant plantar flexion. Patient states that his toe becomes exacerbated every couple of months, with intermittent episodes of pain. Today he presents for this chronic toe pain because he would like to go on a hike in September and would like medical management to help with the pain while getting into shape. Patient has modified his shoes to deal with pain in the past. Denies any swelling or redness. No hx of gout or diabetes  ROS: no numbness or tingling, no erythema, no swelling.   Past Medical History - No hx of Diabetes or Gout  Reviewed problem list.  Medications- reviewed and updated No additions to family history Social history- patient is a non-smoker  Objective: BP 135/72   Pulse 61   Ht 5\' 9"  (1.753 m)   Wt 185 lb (83.9 kg)   BMI 27.32 kg/m  Gen: NAD, alert, cooperative with exam Right Toe/Foot: No visual abnormalities noted on inspection including splaying or hammertoe. Patient's longitudinal arch appears to be normal. No pain with palpation of distal first metatarsal joint or other metatarsal joints, possible small subluxation of distal metatarsal joint noted on palpation. Decreased range of motion with flexion and extension of first digit. 5 out of 5 strength with flexion and extension. Neurovascularly intact.  Left Toe/Foot: No visual abnormalities noted on inspection. Normal longitudinal arch. No pain with palpation of  metatarsal joints. Normal range of motion of flexion and extension of first digit. 5 out of 5 strength. Neurovascularly intact  Assessment/Plan: See problem based a/p  Chronic toe pain, left foot Chronic left great toe pain. History of bone spur per patient on x-ray. 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 -Discussed options of treatment for osteoarthritis. Patient preferred a steroid injection at this time. -A steroid injection was performed at 1st distal metatarsal joint via ultrasound guidance using 1% plain Lidocaine and 20 mg of Kenalog. This was well tolerated.  - Ibuprofen for pain as needed    Consent obtained and verified. Time-out conducted. Noted no overlying erythema, induration, or other signs of local infection. Skin prepped in a sterile fashion. Topical analgesic spray: Ethyl chloride. Joint: left MTP Needle: 25g 5/8 inch Completed without difficulty. Meds: 0.5cc 1% xylocaine, 0.5cc (20mg ) depomedrol  Advised to call if fevers/chills, erythema, induration, drainage, or persistent bleeding.   Patient seen and evaluated with the resident. I agree with the above plan of care. Patient has known first MTP joint osteoarthritis. He would like to proceed with a cortisone injection. Injection was performed under ultrasound guidance. Patient tolerated this without difficulty. He also has Mobic to take as needed. We also discussed alternating Mobic and over-the-counter ibuprofen which also seems to work well for him. In the future we could consider a first ray post or a repeat injection if he gets good symptom relief. We may also need to get an updated x-ray at some point down the road. Patient  will follow-up as needed.

## 2015-12-01 NOTE — Assessment & Plan Note (Signed)
Chronic left great toe pain. History of bone spur per patient on x-ray. 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 -Discussed options of treatment for osteoarthritis. Patient preferred a steroid injection at this time. -A steroid injection was performed at 1st distal metatarsal joint via ultrasound guidance using 1% plain Lidocaine and 20 mg of Kenalog. This was well tolerated.  - Ibuprofen for pain as needed

## 2015-12-17 MED FILL — CIALIS 5 MG TABLET: 5 | 90 days supply | Qty: 90 | Fill #0

## 2016-03-10 DIAGNOSIS — L570 Actinic keratosis: Secondary | ICD-10-CM | POA: Diagnosis not present

## 2016-03-10 DIAGNOSIS — E785 Hyperlipidemia, unspecified: Secondary | ICD-10-CM | POA: Diagnosis not present

## 2016-03-10 DIAGNOSIS — Z125 Encounter for screening for malignant neoplasm of prostate: Secondary | ICD-10-CM | POA: Diagnosis not present

## 2016-03-10 DIAGNOSIS — F411 Generalized anxiety disorder: Secondary | ICD-10-CM | POA: Diagnosis not present

## 2016-03-10 DIAGNOSIS — Z800 Family history of malignant neoplasm of digestive organs: Secondary | ICD-10-CM | POA: Diagnosis not present

## 2016-03-10 DIAGNOSIS — Z0000 Encounter for general adult medical examination without abnormal findings: Secondary | ICD-10-CM | POA: Diagnosis not present

## 2016-03-10 DIAGNOSIS — N529 Male erectile dysfunction, unspecified: Secondary | ICD-10-CM | POA: Diagnosis not present

## 2016-03-10 DIAGNOSIS — R7301 Impaired fasting glucose: Secondary | ICD-10-CM | POA: Diagnosis not present

## 2016-03-10 DIAGNOSIS — J309 Allergic rhinitis, unspecified: Secondary | ICD-10-CM | POA: Diagnosis not present

## 2016-03-11 MED FILL — CIALIS 5 MG TABLET: 5 | 90 days supply | Qty: 90 | Fill #1

## 2016-03-11 MED FILL — SERTRALINE HCL 50 MG TABLET: 50 | 90 days supply | Qty: 90 | Fill #1

## 2016-04-09 DIAGNOSIS — L820 Inflamed seborrheic keratosis: Secondary | ICD-10-CM | POA: Diagnosis not present

## 2016-04-09 DIAGNOSIS — Z1283 Encounter for screening for malignant neoplasm of skin: Secondary | ICD-10-CM | POA: Diagnosis not present

## 2016-04-09 DIAGNOSIS — X32XXXD Exposure to sunlight, subsequent encounter: Secondary | ICD-10-CM | POA: Diagnosis not present

## 2016-04-09 DIAGNOSIS — L570 Actinic keratosis: Secondary | ICD-10-CM | POA: Diagnosis not present

## 2016-04-21 DIAGNOSIS — H5213 Myopia, bilateral: Secondary | ICD-10-CM | POA: Diagnosis not present

## 2016-04-21 DIAGNOSIS — H524 Presbyopia: Secondary | ICD-10-CM | POA: Diagnosis not present

## 2016-06-15 MED FILL — MELOXICAM 15 MG TABLET: 15 | 30 days supply | Qty: 30 | Fill #1

## 2016-06-15 MED FILL — CIALIS 5 MG TABLET: 5 | 90 days supply | Qty: 90 | Fill #0

## 2016-06-15 MED FILL — SERTRALINE HCL 50 MG TABLET: 50 | 90 days supply | Qty: 90 | Fill #0

## 2016-06-17 DIAGNOSIS — H3552 Pigmentary retinal dystrophy: Secondary | ICD-10-CM | POA: Diagnosis not present

## 2016-09-06 MED FILL — TAMSULOSIN HCL 0.4 MG CAP: 0.4 | 90 days supply | Qty: 90 | Fill #0

## 2016-09-07 MED FILL — SERTRALINE HCL 50 MG TABLET: 50 | 90 days supply | Qty: 90 | Fill #1

## 2016-11-08 ENCOUNTER — Encounter: Payer: Self-pay | Admitting: Sports Medicine

## 2016-11-08 ENCOUNTER — Ambulatory Visit (INDEPENDENT_AMBULATORY_CARE_PROVIDER_SITE_OTHER): Payer: 59 | Admitting: Sports Medicine

## 2016-11-08 VITALS — BP 110/60 | Ht 69.0 in | Wt 185.0 lb

## 2016-11-08 DIAGNOSIS — M79675 Pain in left toe(s): Secondary | ICD-10-CM | POA: Diagnosis not present

## 2016-11-08 DIAGNOSIS — G8929 Other chronic pain: Secondary | ICD-10-CM

## 2016-11-08 MED ORDER — METHYLPREDNISOLONE ACETATE 40 MG/ML IJ SUSP
40.0000 mg | Freq: Once | INTRAMUSCULAR | Status: AC
  Administered 2016-11-08: 40 mg via INTRA_ARTICULAR

## 2016-11-08 NOTE — Progress Notes (Signed)
   Subjective:    Patient ID: Benjamin Nichols, male    DOB: June 26, 1955, 61 y.o.   MRN: [id number]  HPI chief complaint: Left great toe pain  Patient comes in today requesting a repeat cortisone injection into the left MTP joint of his first toe. He has a history of OA in this toe. Last cortisone injection was one year ago. Provided about 6 months of relief. He has a trip planned to [place] in one month. He would like a repeat injection today.    Review of Systems    as above Objective:   Physical Exam  Well-developed, well-nourished. No acute distress  Left foot: Examination of the left foot with attention to the left great toe shows significant hallux rigidus. Palpable spur at the dorsum of the joint. No soft tissue swelling. No effusion. Neurovascularly intact distally.      Assessment & Plan:   Left great toe pain secondary to MTP OA  Patient's left MTP joint was injected today with cortisone under ultrasound guidance. Patient tolerates this without difficulty. If symptoms persist despite today's injection then we need to get updated x-rays. Follow-up as needed.  Consent obtained and verified. Time-out conducted. Noted no overlying erythema, induration, or other signs of local infection. Skin prepped in a sterile fashion. Topical analgesic spray: Ethyl chloride. Joint: left first MTP Needle: 25g 5/8 inch Completed without difficulty. Meds: 0.5cc (1% xylocaine), 0.5cc (20mg ) depomedrol  Advised to call if fevers/chills, erythema, induration, drainage, or persistent bleeding.

## 2016-11-08 NOTE — Addendum Note (Signed)
Addended by: Cyd Silence on: 11/08/2016 09:16 AM   Modules accepted: Orders

## 2016-11-25 MED FILL — TAMSULOSIN HCL 0.4 MG CAP: 0.4 | 90 days supply | Qty: 90 | Fill #0

## 2016-11-25 MED FILL — MELOXICAM 15 MG TABLET: 15 | 30 days supply | Qty: 30 | Fill #0

## 2017-02-25 MED FILL — SERTRALINE HCL 50 MG TABLET: 50 | 90 days supply | Qty: 90 | Fill #0

## 2017-02-25 MED FILL — TAMSULOSIN HCL 0.4 MG CAP: 0.4 | 90 days supply | Qty: 90 | Fill #0

## 2017-04-13 DIAGNOSIS — F419 Anxiety disorder, unspecified: Secondary | ICD-10-CM | POA: Diagnosis not present

## 2017-04-13 DIAGNOSIS — E785 Hyperlipidemia, unspecified: Secondary | ICD-10-CM | POA: Diagnosis not present

## 2017-04-13 DIAGNOSIS — H3550 Unspecified hereditary retinal dystrophy: Secondary | ICD-10-CM | POA: Diagnosis not present

## 2017-04-13 DIAGNOSIS — R7301 Impaired fasting glucose: Secondary | ICD-10-CM | POA: Diagnosis not present

## 2017-04-13 DIAGNOSIS — Z800 Family history of malignant neoplasm of digestive organs: Secondary | ICD-10-CM | POA: Diagnosis not present

## 2017-04-13 DIAGNOSIS — Z125 Encounter for screening for malignant neoplasm of prostate: Secondary | ICD-10-CM | POA: Diagnosis not present

## 2017-04-13 DIAGNOSIS — L570 Actinic keratosis: Secondary | ICD-10-CM | POA: Diagnosis not present

## 2017-04-13 DIAGNOSIS — Z0000 Encounter for general adult medical examination without abnormal findings: Secondary | ICD-10-CM | POA: Diagnosis not present

## 2017-04-13 DIAGNOSIS — N529 Male erectile dysfunction, unspecified: Secondary | ICD-10-CM | POA: Diagnosis not present

## 2017-04-13 DIAGNOSIS — J309 Allergic rhinitis, unspecified: Secondary | ICD-10-CM | POA: Diagnosis not present

## 2017-04-28 DIAGNOSIS — H5213 Myopia, bilateral: Secondary | ICD-10-CM | POA: Diagnosis not present

## 2017-05-31 MED FILL — SERTRALINE HCL 50 MG TABLET: 50 | 90 days supply | Qty: 90 | Fill #0

## 2017-05-31 MED FILL — TAMSULOSIN HCL 0.4 MG CAP: 0.4 | 90 days supply | Qty: 90 | Fill #0

## 2017-06-21 DIAGNOSIS — H3552 Pigmentary retinal dystrophy: Secondary | ICD-10-CM | POA: Diagnosis not present

## 2017-06-21 DIAGNOSIS — H2513 Age-related nuclear cataract, bilateral: Secondary | ICD-10-CM | POA: Diagnosis not present

## 2017-08-03 MED FILL — PEG-3350 SOLUTION: 420 | 1 days supply | Qty: 4000 | Fill #0

## 2017-08-11 DIAGNOSIS — K640 First degree hemorrhoids: Secondary | ICD-10-CM | POA: Diagnosis not present

## 2017-08-11 DIAGNOSIS — Z800 Family history of malignant neoplasm of digestive organs: Secondary | ICD-10-CM | POA: Diagnosis not present

## 2017-08-11 DIAGNOSIS — Z1211 Encounter for screening for malignant neoplasm of colon: Secondary | ICD-10-CM | POA: Diagnosis not present

## 2017-09-01 MED FILL — TAMSULOSIN HCL 0.4 MG CAP: 0.4 | 90 days supply | Qty: 90 | Fill #1

## 2017-09-07 DIAGNOSIS — D225 Melanocytic nevi of trunk: Secondary | ICD-10-CM | POA: Diagnosis not present

## 2017-09-07 DIAGNOSIS — X32XXXD Exposure to sunlight, subsequent encounter: Secondary | ICD-10-CM | POA: Diagnosis not present

## 2017-09-07 DIAGNOSIS — L570 Actinic keratosis: Secondary | ICD-10-CM | POA: Diagnosis not present

## 2017-10-28 MED FILL — SERTRALINE HCL 50 MG TABLET: 50 | 90 days supply | Qty: 90 | Fill #1

## 2017-11-22 MED FILL — TAMSULOSIN HCL 0.4 MG CAP: 0.4 | 90 days supply | Qty: 90 | Fill #2

## 2018-01-06 ENCOUNTER — Encounter: Payer: Self-pay | Admitting: Sports Medicine

## 2018-01-06 ENCOUNTER — Ambulatory Visit: Payer: 59 | Admitting: Sports Medicine

## 2018-01-06 VITALS — BP 112/72 | Ht 68.0 in | Wt 185.0 lb

## 2018-01-06 DIAGNOSIS — M79675 Pain in left toe(s): Secondary | ICD-10-CM

## 2018-01-06 DIAGNOSIS — M19072 Primary osteoarthritis, left ankle and foot: Secondary | ICD-10-CM

## 2018-01-06 MED ORDER — METHYLPREDNISOLONE ACETATE 40 MG/ML IJ SUSP
20.0000 mg | Freq: Once | INTRAMUSCULAR | Status: AC
  Administered 2018-01-06: 20 mg via INTRA_ARTICULAR

## 2018-01-06 NOTE — Progress Notes (Signed)
  DELVECCHIO Nichols - 62 y.o. male MRN [id number]  Date of birth: 09/08/1955    SUBJECTIVE:      Chief Complaint:/ HPI:  62 year old male with left great toe pain for several years.  History of left first MTP arthritis.  Patient has had injections previously with very good results.  He continues to have pain which is most notable with hiking which causes forced extension of the great toe.  Patient would like a steroid injection prior to an upcoming trip to the [facility]  Minimal pain on a daily basis while walking on flat surfaces.  He uses anti-inflammatories as needed such as ibuprofen or meloxicam which are somewhat helpful.  He denies any new symptoms.  No swelling, erythema, bruising.  No numbness or tingling.  No history of gout.  No fevers or chills.   ROS:     The HPI  PERTINENT  PMH / PSH FH / / SH:  Past Medical, Surgical, Social, and Family History Reviewed & Updated in the EMR.  Pertinent findings include:  History of left first MTP arthritis  OBJECTIVE: BP 112/72   Ht 5\' 8"  (1.727 m)   Wt 185 lb (83.9 kg)   BMI 28.13 kg/m   Physical Exam:  Vital signs are reviewed.  GEN: Alert and oriented, NAD Pulm: Breathing unlabored PSY: normal mood, congruent affect  MSK: Left Foot: Inspection:  No obvious bony deformity.  No swelling, erythema, or bruising. Palpation: No tenderness to palpation ROM: Full  ROM of the ankle. Restricted great toe extension due to osteophytes Strength: 5/5 strength ankle in all planes, 5/5 EHL  Neurovascular: N/V intact distally in the lower extremity Special tests:  Negative squeeze. normal midfoot flexibility.    Procedure performed: First MTP joint corticosteroid injection; US guided  Consent obtained and verified. Time-out conducted. Noted no overlying erythema, induration, or other signs of local infection. The left first MTP was visualized with ultrasound. The over overlying skin was prepped prepped in a sterile fashion. Topical  analgesic spray: Ethyl chloride. Joint: Left first MTP Needle: 25-gauge 5/8 inch Completed without difficulty. Meds: DepoMedrol 20 mg, lidocaine 0.5 cc  Advised to call if fevers/chills, erythema, induration, drainage, or persistent bleeding.  ASSESSMENT & PLAN:  1.   left first MTP arthritis and pain: Patient presents today for steroid injection in anticipation of upcoming hiking trip to [facility].  Patient has had steroid injection in the past and has gotten good relief.  Injection today performed today as described above.  Return precautions discussed.  Follow-up as needed  Patient seen and evaluated with the sports medicine fellow. I agree with the above plan of care. Ultrasound guided injection of the left first MTP joint was successful. Patient will follow-up as needed.

## 2018-01-06 NOTE — Patient Instructions (Signed)
Today we injected your left toe with a steroid to treat your arthritis pain. Please call or return to the clinic if you experience any fevers, chills, increased swelling and redness, or severe worsening pain. Otherwise, follow-up as needed

## 2018-01-23 MED FILL — MELOXICAM 15 MG TABLET: 15 | 30 days supply | Qty: 30 | Fill #0

## 2018-01-23 MED FILL — SERTRALINE HCL 50 MG TABLET: 50 | 90 days supply | Qty: 90 | Fill #2

## 2018-02-28 MED FILL — TAMSULOSIN HCL 0.4 MG CAP: 0.4 | 90 days supply | Qty: 90 | Fill #3

## 2018-04-17 MED FILL — SERTRALINE HCL 50 MG TABLET: 50 | 90 days supply | Qty: 90 | Fill #3

## 2018-04-21 ENCOUNTER — Telehealth: Payer: 59 | Admitting: Family

## 2018-04-21 ENCOUNTER — Encounter: Payer: Self-pay | Admitting: Family

## 2018-04-21 DIAGNOSIS — R6889 Other general symptoms and signs: Secondary | ICD-10-CM

## 2018-04-21 MED ORDER — OSELTAMIVIR PHOSPHATE 75 MG PO CAPS: 75.0000 mg | ORAL_CAPSULE | Freq: Two times a day (BID) | ORAL | 0 refills | Status: DC

## 2018-04-21 MED ORDER — ALBUTEROL SULFATE 108 (90 BASE) MCG/ACT IN AEPB: 2.0000 | INHALATION_SPRAY | Freq: Four times a day (QID) | RESPIRATORY_TRACT | 2 refills | Status: AC | PRN

## 2018-04-21 MED FILL — OSELTAMIVIR PHOSPHATE 75 MG: 75 | 5 days supply | Qty: 10 | Fill #0

## 2018-04-21 MED FILL — PROAIR RESPICLICK INHAL PWD: 108 (90 BAS | 25 days supply | Qty: 1 | Fill #0

## 2018-04-21 NOTE — Progress Notes (Signed)
Thank you for the details you included in the comment boxes. Those details are very helpful in determining the best course of treatment for you and help [place] to provide the best care. Given that you work at [employer], it would stand to reason that you have a higher chance of flu exposure. I am sending treatment as below to cover you when you travel. However, without rash, high fever, and other symptoms, this could just be a run-of-the-mill viral infection and not the flu. Please watch your symptoms over the next 24h for rash, high fever, and worsening myalgias as well as other severe flu symptoms; take the Tamiflu if this occurs. I want you to be covered for your trip.I also refilled the inhaler.   E visit for Flu like symptoms   We are sorry that you are not feeling well.  Here is how we plan to help! Based on what you have shared with me it looks like you may have a respiratory virus that may be influenza.  Influenza or "the flu" is   an infection caused by a respiratory virus. The flu virus is highly contagious and persons who did not receive their yearly flu vaccination may "catch" the flu from close contact.  We have anti-viral medications to treat the viruses that cause this infection. They are not a "cure" and only shorten the course of the infection. These prescriptions are most effective when they are given within the first 2 days of "flu" symptoms. Antiviral medication are indicated if you have a high risk of complications from the flu. You should  also consider an antiviral medication if you are in close contact with someone who is at risk. These medications can help patients avoid complications from the flu  but have side effects that you should know. Possible side effects from Tamiflu or oseltamivir include nausea, vomiting, diarrhea, dizziness, headaches, eye redness, sleep problems or other respiratory symptoms. You should not take Tamiflu if you have an allergy to oseltamivir or any to the  ingredients in Tamiflu.  Based upon your symptoms and potential risk factors I have prescribed Oseltamivir (Tamiflu).  It has been sent to your designated pharmacy.  You will take one 75 mg capsule orally twice a day for the next 5 days.  ANYONE WHO HAS FLU SYMPTOMS SHOULD: . Stay home. The flu is highly contagious and going out or to work exposes others! . Be sure to drink plenty of fluids. Water is fine as well as fruit juices, sodas and electrolyte beverages. You may want to stay away from caffeine or alcohol. If you are nauseated, try taking small sips of liquids. How do you know if you are getting enough fluid? Your urine should be a pale yellow or almost colorless. . Get rest. . Taking a steamy shower or using a humidifier may help nasal congestion and ease sore throat pain. Using a saline nasal spray works much the same way. . Cough drops, hard candies and sore throat lozenges may ease your cough. . Line up a caregiver. Have someone check on you regularly.   GET HELP RIGHT AWAY IF: . You cannot keep down liquids or your medications. . You become short of breath . Your fell like you are going to pass out or loose consciousness. . Your symptoms persist after you have completed your treatment plan MAKE SURE YOU   Understand these instructions.  Will watch your condition.  Will get help right away if you are not doing well or  get worse.  Your e-visit answers were reviewed by a board certified advanced clinical practitioner to complete your personal care plan.  Depending on the condition, your plan could have included both over the counter or prescription medications.  If there is a problem please reply  once you have received a response from your provider.  Your safety is important to [place].  If you have drug allergies check your prescription carefully.    You can use MyChart to ask questions about today's visit, request a non-urgent call back, or ask for a work or school excuse for  24 hours related to this e-Visit. If it has been greater than 24 hours you will need to follow up with your provider, or enter a new e-Visit to address those concerns.  You will get an e-mail in the next two days asking about your experience.  I hope that your e-visit has been valuable and will speed your recovery. Thank you for using e-visits.

## 2018-05-17 ENCOUNTER — Telehealth: Payer: 59 | Admitting: Family

## 2018-05-17 DIAGNOSIS — J029 Acute pharyngitis, unspecified: Secondary | ICD-10-CM | POA: Diagnosis not present

## 2018-05-17 MED ORDER — AMOXICILLIN 875 MG PO TABS: 875.0000 mg | ORAL_TABLET | Freq: Two times a day (BID) | ORAL | 0 refills | Status: DC

## 2018-05-17 NOTE — Progress Notes (Signed)
We are sorry that you are not feeling well.  Here is how we plan to help!  Based on what you have shared with me it is likely that you have strep pharyngitis.  Strep pharyngitis is inflammation and infection in the back of the throat.  This is an infection cause by bacteria and is treated with antibiotics.  I have prescribed Amoxicillin  875 mg one tablet twice daily for 7 days. For throat pain, we recommend over the counter oral pain relief medications such as acetaminophen or aspirin, or anti-inflammatory medications such as ibuprofen or naproxen sodium. Topical treatments such as oral throat lozenges or sprays may be used as needed. Strep infections are not as easily transmitted as other respiratory infections, however we still recommend that you avoid close contact with loved ones, especially the very young and elderly.  Remember to wash your hands thoroughly throughout the day as this is the number one way to prevent the spread of infection and wipe down door knobs and counters with disinfectant.   Home Care:  Only take medications as instructed by your medical team.  Complete the entire course of an antibiotic.  Do not take these medications with alcohol.  A steam or ultrasonic humidifier can help congestion.  You can place a towel over your head and breathe in the steam from hot water coming from a faucet.  Avoid close contacts especially the very young and the elderly.  Cover your mouth when you cough or sneeze.  Always remember to wash your hands.  Get Help Right Away If:  You develop worsening fever or sinus pain.  You develop a severe head ache or visual changes.  Your symptoms persist after you have completed your treatment plan.  Make sure you  Understand these instructions.  Will watch your condition.  Will get help right away if you are not doing well or get worse.  Your e-visit answers were reviewed by a board certified advanced clinical practitioner to complete  your personal care plan.  Depending on the condition, your plan could have included both over the counter or prescription medications.  If there is a problem please reply  once you have received a response from your provider.  Your safety is important to [place].  If you have drug allergies check your prescription carefully.    You can use MyChart to ask questions about today's visit, request a non-urgent call back, or ask for a work or school excuse for 24 hours related to this e-Visit. If it has been greater than 24 hours you will need to follow up with your provider, or enter a new e-Visit to address those concerns.  You will get an e-mail in the next two days asking about your experience.  I hope that your e-visit has been valuable and will speed your recovery. Thank you for using e-visits.

## 2018-05-18 MED FILL — AMOXICILLIN 875 MG TABS: 875 | 7 days supply | Qty: 14 | Fill #0

## 2018-05-25 DIAGNOSIS — J309 Allergic rhinitis, unspecified: Secondary | ICD-10-CM | POA: Diagnosis not present

## 2018-05-25 DIAGNOSIS — N401 Enlarged prostate with lower urinary tract symptoms: Secondary | ICD-10-CM | POA: Diagnosis not present

## 2018-05-25 DIAGNOSIS — R7301 Impaired fasting glucose: Secondary | ICD-10-CM | POA: Diagnosis not present

## 2018-05-25 DIAGNOSIS — E785 Hyperlipidemia, unspecified: Secondary | ICD-10-CM | POA: Diagnosis not present

## 2018-05-25 DIAGNOSIS — Z0000 Encounter for general adult medical examination without abnormal findings: Secondary | ICD-10-CM | POA: Diagnosis not present

## 2018-05-25 DIAGNOSIS — Z23 Encounter for immunization: Secondary | ICD-10-CM | POA: Diagnosis not present

## 2018-05-25 DIAGNOSIS — F419 Anxiety disorder, unspecified: Secondary | ICD-10-CM | POA: Diagnosis not present

## 2018-05-31 MED FILL — TAMSULOSIN HCL 0.4 MG CAP: 0.4 | 90 days supply | Qty: 90 | Fill #0

## 2018-06-01 MED FILL — SHINGRIX 50 MCG SUS: 50 | 1 days supply | Qty: 1 | Fill #0

## 2018-06-14 DIAGNOSIS — H5213 Myopia, bilateral: Secondary | ICD-10-CM | POA: Diagnosis not present

## 2018-06-19 ENCOUNTER — Ambulatory Visit: Payer: 59 | Admitting: Sports Medicine

## 2018-06-19 ENCOUNTER — Encounter: Payer: Self-pay | Admitting: Sports Medicine

## 2018-06-19 VITALS — BP 120/60 | Ht 68.0 in | Wt 185.0 lb

## 2018-06-19 DIAGNOSIS — M25511 Pain in right shoulder: Secondary | ICD-10-CM

## 2018-06-19 MED ORDER — METHYLPREDNISOLONE ACETATE 40 MG/ML IJ SUSP
40.0000 mg | Freq: Once | INTRAMUSCULAR | Status: AC
  Administered 2018-06-19: 40 mg via INTRA_ARTICULAR

## 2018-06-19 NOTE — Progress Notes (Signed)
Subjective:     Patient ID: Benjamin Nichols, male   DOB: [birth year], 63 y.o.   MRN: [id number]  HPI Leonor Liv is a 63yo with PMHx of R AC joint osteoarthritis and L adhesive capsulitis who presents for 8 weeks of pain and limited ROM of the R shoulder. He reports that the symptoms began when he reached very quickly with shoulder abduction and flexion for a falling object, at which time he felt sudden pain to the lateral shoulder. Ever since then he has had gradually worsening limited ROM, pain with adduction and flexion of the shoulder, and some weakness. He has a hard time putting on a jacket, and notices that he can't reach his face as far toward his elbow as he can with his Left, as he would to cover a sneeze. This motion also causes him pain. He notices worse pain in the mornings with sleeping in aggravating positions. He does not have pain at rest. He has been taking 600mg  Advil BID for this pain with some relief. No radiation of the pain, no numbness or tingling. No symptoms of his L shoulder. He does get pain at night when sleeping on his right shoulder.  Review of Systems As above.    Objective:   Physical Exam  R shoulder: no swelling, ecchymoses, erythema, or redness; no tenderness to palpation of joints or scapula; active forward flexion limited by about 15 degrees in comparison to the L, active abduction limited by about 15 degrees in comparison to the L, active and passive internal and external rotation full and equal to the L; 5/5 strength of shoulder abduction, flexion, and external rotation and of rotator cuff; 4/5 strength of external rotation in comparison to the L; negative empty can test, impingement testing including Hawkins and O'Brien's positive for pain; lift-off test positive for pain and mild weakness in comparison to L  L shoulder: no swelling, ecchymoses, erythema, or redness; no tenderness to palpation of joints or scapula; full active forward flexion, full active abduction,  active internal and external rotation full and equal to the R; 5/5 strength of shoulder abduction, flexion, internal and external rotation and of rotator cuff; negative empty can test, impingement testing including Hawkins and O'Brien's negative for pain; lift-off test negative for pain and weakness in comparison to R    Assessment:     Most likely a strain of rotator cuff tendon vs partial tear of rotator cuff tendon given mechanism of injury and physical exam. Less likely adhesive capsulitis given the full passive external rotation available to the R shoulder. Weakness and limited ROM and pain on exam can be explained by rotator cuff strain. Less likely impingement given a likely injury as opposed to gradual onset without provoking incident. Performed subacromial steroid injection today and referred for PT, will see back in 4 weeks. If no improvement, should consider further workup with imaging to evaluate for other possible etiology.    Plan:     - Subacromial steroid injection today - PT for rotator cuff strain strengthening - Return to clinic in 4 weeks - Can consider imaging if no improvement by then  Tedra Coupe, MS4     Patient seen and evaluated with the medical student.  I agree with the above plan of care.  Patient's presentation today is that of either a rotator cuff strain or a partial rotator cuff tear.  Adhesive capsulitis is less likely given his full passive and active range of motion.  I recommended a subacromial cortisone  injection and physical therapy.  Patient will follow-up with me in 4 weeks for reevaluation.  If symptoms persist consider merits of imaging at that time.  Patient is encouraged to call with questions or concerns in the interim.  Consent obtained and verified. Time-out conducted. Noted no overlying erythema, induration, or other signs of local infection. Skin prepped in a sterile fashion. Topical analgesic spray: Ethyl chloride. Joint: right shoulder  (subacromial) Needle: 25g 1.5 inch Completed without difficulty. Meds: 3cc 1% xylocaine, 1cc (40mg ) depomedrol  Advised to call if fevers/chills, erythema, induration, drainage, or persistent bleeding.

## 2018-06-20 ENCOUNTER — Encounter: Payer: Self-pay | Admitting: Sports Medicine

## 2018-06-20 DIAGNOSIS — X32XXXD Exposure to sunlight, subsequent encounter: Secondary | ICD-10-CM | POA: Diagnosis not present

## 2018-06-20 DIAGNOSIS — L570 Actinic keratosis: Secondary | ICD-10-CM | POA: Diagnosis not present

## 2018-06-20 DIAGNOSIS — D225 Melanocytic nevi of trunk: Secondary | ICD-10-CM | POA: Diagnosis not present

## 2018-06-20 DIAGNOSIS — L820 Inflamed seborrheic keratosis: Secondary | ICD-10-CM | POA: Diagnosis not present

## 2018-06-20 DIAGNOSIS — L821 Other seborrheic keratosis: Secondary | ICD-10-CM | POA: Diagnosis not present

## 2018-06-27 ENCOUNTER — Other Ambulatory Visit: Payer: Self-pay

## 2018-06-27 ENCOUNTER — Ambulatory Visit: Payer: 59 | Attending: Sports Medicine

## 2018-06-27 DIAGNOSIS — M25611 Stiffness of right shoulder, not elsewhere classified: Secondary | ICD-10-CM | POA: Diagnosis not present

## 2018-06-27 DIAGNOSIS — M25511 Pain in right shoulder: Secondary | ICD-10-CM | POA: Insufficient documentation

## 2018-06-27 NOTE — Therapy (Signed)
[place], [place], [address] Phone: [phone]   Fax:  [phone]  Physical Therapy Evaluation  Patient Details  Name: Benjamin Nichols MRN: [id number] Date of Birth: July 13, 1955 Referring Provider (PT): Lilia Argue, DO   Encounter Date: 06/27/2018  PT End of Session - 06/27/18 1224    Visit Number  1    Number of Visits  12    Date for PT Re-Evaluation  08/04/18    Authorization Type  UMR    PT Start Time  1225    PT Stop Time  1320    PT Time Calculation (min)  55 min    Activity Tolerance  Patient tolerated treatment well    Behavior During Therapy  [facility] for tasks assessed/performed       History reviewed. No pertinent past medical history.  History reviewed. No pertinent surgical history.  There were no vitals filed for this visit.   Subjective Assessment - 06/27/18 1234    Subjective  he reports reaching for falling object forward  and had bad pain but now pain only with reaching mostly reaching behind .    Pertinent History  cortisone injection;      Limitations  Lifting   sleeping on RT shoulder and reaching behind   How long can you stand comfortably?   long term benefit    Diagnostic tests  None    Patient Stated Goals  To use     Currently in Pain?  No/denies    Pain Score  --   in past 5 days 4/10 but stops as movement or position stops   Pain Location  Shoulder    Pain Orientation  Right    Pain Descriptors / Indicators  Sharp    Pain Type  Phantom pain    Pain Onset  More than a month ago    Pain Frequency  Intermittent    Aggravating Factors   reching behind back    Pain Relieving Factors  stop movement         [facility] PT Assessment - 06/27/18 0001      Assessment   Medical Diagnosis  Acute RT shoulder pain    Referring Provider (PT)  Lilia Argue, DO    Onset Date/Surgical Date  --   2-3 minths ago   Hand Dominance  Right    Prior Therapy  For LT shoulder      Precautions   Precautions  None      Restrictions   Weight Bearing Restrictions  No      Balance Screen   Has the patient fallen in the past 6 months  No      Prior Function   Level of Independence  Independent      Observation/Other Assessments   Focus on Therapeutic Outcomes (FOTO)   39% limited , goal 29% limited      ROM / Strength   AROM / PROM / Strength  AROM;PROM;Strength      AROM   AROM Assessment Site  Shoulder    Right/Left Shoulder  Right;Left    Right Shoulder Extension  60 Degrees    Right Shoulder Flexion  135 Degrees    Right Shoulder ABduction  150 Degrees    Right Shoulder Internal Rotation  40 Degrees   reach behind back 4 in < RT    Right Shoulder External Rotation  80 Degrees    Right Shoulder Horizontal ABduction  20 Degrees  Right Shoulder Horizontal  ADduction  100 Degrees    Left Shoulder Extension  55 Degrees    Left Shoulder Flexion  148 Degrees    Left Shoulder ABduction  155 Degrees    Left Shoulder Internal Rotation  54 Degrees    Left Shoulder External Rotation  90 Degrees      PROM   PROM Assessment Site  Shoulder    Right/Left Shoulder  Right    Right Shoulder Flexion  155 Degrees    Right Shoulder ABduction  160 Degrees    Right Shoulder Internal Rotation  45 Degrees   passive equal Lt    Right Shoulder External Rotation  85 Degrees                Objective measurements completed on examination: See above findings.              PT Education - 06/27/18 1313    Education Details  HEP  POC     Person(s) Educated  Patient    Methods  Demonstration;Explanation;Tactile cues;Verbal cues;Handout    Comprehension  Verbalized understanding;Returned demonstration       PT Short Term Goals - 06/27/18 1226      PT SHORT TERM GOAL #1   Title  Patient is to be independent with a HEP for initial stretches.     Time  3    Status  New      PT SHORT TERM GOAL #2   Title  Patient is to demo 140 degrees of Rt shoulder  flexion for reaching tasks.     Time  3    Period  Weeks    Status  New      PT SHORT TERM GOAL #3   Title  Patient is to demo Rt shoulder IR to equal LT  for donning a coat without pain.     Time  3    Period  Weeks    Status  New        PT Long Term Goals - 06/27/18 1227      PT LONG TERM GOAL #1   Title  Patient is to be independent with a HEP for ROM and strengthening exercises.     Time  6    Period  Weeks    Status  New      PT LONG TERM GOAL #2   Title  Patient is to demon greater to or equal to 150 degrees of Rt shoulder flexion for reaching to top shelves.     Time  6    Period  Weeks    Status  New      PT LONG TERM GOAL #3   Title  He will be able to reach to side for belt and to dress without pain    Time  6    Period  Weeks    Status  New      PT LONG TERM GOAL #4   Title  Patient to rate his pain as equal or less than 1/10 with all activity for all activity tolerance.     Period  Weeks    Status  New      PT LONG TERM GOAL #5   Title  Patient is to demo 5/5 strenght with lt shoulder flexion for lifting tasks with 1/10 or less.     Time  6    Period  Weeks    Status  New  Additional Long Term Goals   Additional Long Term Goals  Yes      PT LONG TERM GOAL #6   Title  FOTO decreased to 29% limited from  39% limited    Time  6    Period  Weeks    Status  New             Plan - 06/27/18 1225    Clinical Impression Statement  Pt reports RT shoulder pain from reaching to catch a falling object.  He is improving but still has pain with reaching behind back and lifting weight and overhead.  Generally no pain. PROM essentially equal to Lt   He will benfit from strengthening program    History and Personal Factors relevant to plan of care:  Lt shoulder pain    Clinical Presentation  Stable    Clinical Decision Making  Low    Rehab Potential  Good    PT Frequency  1x / week    PT Duration  6 weeks    PT Treatment/Interventions  Passive range  of motion;Manual techniques;Therapeutic exercise;Patient/family education;Iontophoresis 4mg /ml Dexamethasone;Ultrasound;Cryotherapy;Moist Heat    PT Next Visit Plan  Manual modalites for pain and ROM  progress strengthening program    PT Home Exercise Plan  Rockwood , cross chest stretch , behind back stretch    Consulted and Agree with Plan of Care  Patient       Patient will benefit from skilled therapeutic intervention in order to improve the following deficits and impairments:  Pain, Impaired UE functional use, Decreased range of motion  Visit Diagnosis: Acute pain of right shoulder - Plan: PT plan of care cert/re-cert  Stiffness of right shoulder, not elsewhere classified - Plan: PT plan of care cert/re-cert     Problem List Patient Active Problem List   Diagnosis Date Noted  . Chronic toe pain, left foot 12/01/2015  . Adhesive capsulitis of left shoulder 04/17/2015    Darrel Hoover  PT 06/27/2018, 1:30 PM  [facility] [address] [place], [place], [address] Phone: [phone]   Fax:  [phone]  Name: HAGOP MCCOLLAM MRN: [id number] Date of Birth: 1955/10/26

## 2018-06-27 NOTE — Patient Instructions (Signed)
Rockwood red band x 10-20 reps  Daily , Cross body stretch and behind back 30 sec x 2-3 2-3x/day

## 2018-07-04 ENCOUNTER — Ambulatory Visit: Payer: 59 | Attending: Sports Medicine

## 2018-07-04 DIAGNOSIS — M25611 Stiffness of right shoulder, not elsewhere classified: Secondary | ICD-10-CM | POA: Diagnosis not present

## 2018-07-04 DIAGNOSIS — M25511 Pain in right shoulder: Secondary | ICD-10-CM | POA: Diagnosis not present

## 2018-07-04 NOTE — Therapy (Signed)
[place], [place], [address] Phone: [phone]   Fax:  [phone]  Physical Therapy Treatment  Patient Details  Name: Benjamin Nichols MRN: [id number] Date of Birth: 1955-10-03 Referring Provider (PT): Lilia Argue, DO   Encounter Date: 07/04/2018  PT End of Session - 07/04/18 1059    Visit Number  2    Number of Visits  12    Date for PT Re-Evaluation  08/04/18    Authorization Type  UMR    PT Start Time  1015    PT Stop Time  1058    PT Time Calculation (min)  43 min    Activity Tolerance  Patient tolerated treatment well    Behavior During Therapy  [facility] for tasks assessed/performed       History reviewed. No pertinent past medical history.  History reviewed. No pertinent surgical history.  There were no vitals filed for this visit.  Subjective Assessment - 07/04/18 1028    Subjective  No pain at rest but pain with some movements. Better     Currently in Pain?  No/denies                       [facility] Adult PT Treatment/Exercise - 07/04/18 0001      Exercises   Exercises  Shoulder      Shoulder Exercises: Supine   External Rotation  Right;20 reps    Theraband Level (Shoulder External Rotation)  Level 3 (Green)    Internal Rotation  Right;20 reps    Theraband Level (Shoulder Internal Rotation)  Level 3 (Green)    Flexion  Right;20 reps    Theraband Level (Shoulder Flexion)  Level 3 (Green)      Shoulder Exercises: Standing   External Rotation  Right;20 reps    Theraband Level (Shoulder External Rotation)  Level 3 (Green)    Internal Rotation  Right;20 reps    Theraband Level (Shoulder Internal Rotation)  Level 3 (Green)    Flexion  Right;20 reps    Theraband Level (Shoulder Flexion)  Level 3 (Green)    ABduction  Both;20 reps    Theraband Level (Shoulder ABduction)  Level 3 (Green)    Extension  Right;20 reps    Theraband Level (Shoulder Extension)  Level 3 (Green)     Row  20 reps    Theraband Level (Shoulder Row)  Level 3 (Green)      Manual Therapy   Manual therapy comments  Mob with movement for reaching up back and  ER with hand behind head.   Pain decr to twinge at end range               PT Short Term Goals - 07/04/18 1101      PT SHORT TERM GOAL #1   Title  Patient is to be independent with a HEP for initial stretches.     Status  Achieved        PT Long Term Goals - 06/27/18 1227      PT LONG TERM GOAL #1   Title  Patient is to be independent with a HEP for ROM and strengthening exercises.     Time  6    Period  Weeks    Status  New      PT LONG TERM GOAL #2   Title  Patient is to demon greater to or equal to 150 degrees of Rt shoulder flexion for reaching to  top shelves.     Time  6    Period  Weeks    Status  New      PT LONG TERM GOAL #3   Title  He will be able to reach to side for belt and to dress without pain    Time  6    Period  Weeks    Status  New      PT LONG TERM GOAL #4   Title  Patient to rate his pain as equal or less than 1/10 with all activity for all activity tolerance.     Period  Weeks    Status  New      PT LONG TERM GOAL #5   Title  Patient is to demo 5/5 strenght with lt shoulder flexion for lifting tasks with 1/10 or less.     Time  6    Period  Weeks    Status  New      Additional Long Term Goals   Additional Long Term Goals  Yes      PT LONG TERM GOAL #6   Title  FOTO decreased to 29% limited from  39% limited    Time  6    Period  Weeks    Status  New            Plan - 07/04/18 1100    Clinical Impression Statement  ROM increased active and decreased pain post session. Increaded to green band for exercise..   was able to done coat without pain.     PT Treatment/Interventions  Passive range of motion;Manual techniques;Therapeutic exercise;Patient/family education;Iontophoresis 4mg /ml Dexamethasone;Ultrasound;Cryotherapy;Moist Heat    PT Next Visit Plan  Manual  modalites for pain and ROM  progress strengthening program    PT Home Exercise Plan  Rockwood , cross chest stretch , behind back stretch, abduction green band    Consulted and Agree with Plan of Care  Patient       Patient will benefit from skilled therapeutic intervention in order to improve the following deficits and impairments:  Pain, Impaired UE functional use, Decreased range of motion  Visit Diagnosis: Acute pain of right shoulder  Stiffness of right shoulder, not elsewhere classified     Problem List Patient Active Problem List   Diagnosis Date Noted  . Chronic toe pain, left foot 12/01/2015  . Adhesive capsulitis of left shoulder 04/17/2015    Benjamin Nichols  PT 07/04/2018, 11:02 AM  [facility] [address] [place], [place], [address] Phone: [phone]   Fax:  [phone]  Name: Benjamin Nichols MRN: [id number] Date of Birth: Jul 22, 1955

## 2018-07-05 DIAGNOSIS — H3552 Pigmentary retinal dystrophy: Secondary | ICD-10-CM | POA: Diagnosis not present

## 2018-07-05 DIAGNOSIS — H2513 Age-related nuclear cataract, bilateral: Secondary | ICD-10-CM | POA: Diagnosis not present

## 2018-07-11 ENCOUNTER — Ambulatory Visit: Payer: 59

## 2018-07-11 DIAGNOSIS — M25611 Stiffness of right shoulder, not elsewhere classified: Secondary | ICD-10-CM

## 2018-07-11 DIAGNOSIS — M25511 Pain in right shoulder: Secondary | ICD-10-CM | POA: Diagnosis not present

## 2018-07-11 NOTE — Therapy (Addendum)
[place], [place], [address] Phone: [phone]   Fax:  [phone]  Physical Therapy Treatment/Discharge  Patient Details  Name: Benjamin Nichols MRN: [id number] Date of Birth: [birth year] Referring Provider (PT): Lilia Argue, DO   Encounter Date: 07/11/2018  PT End of Session - 07/11/18 0845    Visit Number  3    Number of Visits  12    Date for PT Re-Evaluation  08/04/18    Authorization Type  UMR    PT Start Time  0845    PT Stop Time  0925    PT Time Calculation (min)  40 min    Activity Tolerance  Patient tolerated treatment well    Behavior During Therapy  [facility] for tasks assessed/performed       History reviewed. No pertinent past medical history.  History reviewed. No pertinent surgical history.  There were no vitals filed for this visit.  Subjective Assessment - 07/11/18 0846    Subjective  No pain at rest . Still some pain with movement    Currently in Pain?  No/denies                                 PT Short Term Goals - 07/04/18 1101      PT SHORT TERM GOAL #1   Title  Patient is to be independent with a HEP for initial stretches.     Status  Achieved        PT Long Term Goals - 06/27/18 1227      PT LONG TERM GOAL #1   Title  Patient is to be independent with a HEP for ROM and strengthening exercises.     Time  6    Period  Weeks    Status  New      PT LONG TERM GOAL #2   Title  Patient is to demon greater to or equal to 150 degrees of Rt shoulder flexion for reaching to top shelves.     Time  6    Period  Weeks    Status  New      PT LONG TERM GOAL #3   Title  He will be able to reach to side for belt and to dress without pain    Time  6    Period  Weeks    Status  New      PT LONG TERM GOAL #4   Title  Patient to rate his pain as equal or less than 1/10 with all activity for all activity tolerance.     Period  Weeks    Status  New       PT LONG TERM GOAL #5   Title  Patient is to demo 5/5 strenght with lt shoulder flexion for lifting tasks with 1/10 or less.     Time  6    Period  Weeks    Status  New      Additional Long Term Goals   Additional Long Term Goals  Yes      PT LONG TERM GOAL #6   Title  FOTO decreased to 29% limited from  39% limited    Time  6    Period  Weeks    Status  New            Plan - 07/11/18 0845    Clinical Impression Statement  Generally no pain.        PT Treatment/Interventions  Passive range of motion;Manual techniques;Therapeutic exercise;Patient/family education;Iontophoresis 4mg/ml Dexamethasone;Ultrasound;Cryotherapy;Moist Heat    PT Next Visit Plan  Manual modalites for pain and ROM  progress strengthening program    PT Home Exercise Plan  Rockwood , cross chest stretch , behind back stretch, abduction green band    Consulted and Agree with Plan of Care  Patient       Patient will benefit from skilled therapeutic intervention in order to improve the following deficits and impairments:  Pain, Impaired UE functional use, Decreased range of motion  Visit Diagnosis: Acute pain of right shoulder  Stiffness of right shoulder, not elsewhere classified     Problem List Patient Active Problem List   Diagnosis Date Noted  . Chronic toe pain, left foot 12/01/2015  . Adhesive capsulitis of left shoulder 04/17/2015    Benjamin Nichols, Benjamin Nichols  PT 07/11/2018, 8:50 AM  [facility] [address] [place], [place], [address] Phone: [phone]   Fax:  [phone]  Name: Benjamin Nichols MRN: [id number] Date of Birth: 09/14/1955 PHYSICAL THERAPY DISCHARGE SUMMARY  Visits from Start of Care: 3  Current functional level related to goals / functional outcomes:See Dr Drapers office note and phone call.    Remaining deficits: See above   Education / Equipment: HEP  Plan: Patient agrees to discharge.  Patient goals were not  met. Patient is being discharged due to a change in medical status.  ?????    Steve Benjamin Nichols  PT  

## 2018-07-17 ENCOUNTER — Other Ambulatory Visit: Payer: Self-pay

## 2018-07-17 ENCOUNTER — Ambulatory Visit: Payer: 59 | Admitting: Sports Medicine

## 2018-07-17 VITALS — BP 120/76 | Ht 68.0 in | Wt 183.0 lb

## 2018-07-17 DIAGNOSIS — M25511 Pain in right shoulder: Secondary | ICD-10-CM

## 2018-07-17 NOTE — Progress Notes (Signed)
   Subjective:    Patient ID: Benjamin Nichols, male    DOB: 07-Jul-1955, 63 y.o.   MRN: [id number]  HPI   Tradarius comes in today for follow-up on right shoulder pain after right shoulder injury 2 months ago.  Limited improvement with recent subacromial cortisone injection and physical therapy.  He still notes intermittent pain when placing his arm in certain positions, especially away from his body or when fully externally rotated.  He has noticed some stiffness but his main complaint is weakness in the right arm.  He also endorses nighttime pain.   Review of Systems    As above Objective:   Physical Exam  Well-developed, well-nourished.  No acute distress.  Awake alert and oriented x3.  Vital signs reviewed  Examination of the right shoulder shows full active and passive range of motion with forward flexion and abduction.  Full passive external rotation.  He does have some limited internal rotation actively but nearly full passive internal rotation.  He does have 4+/5 strength with resisted supraspinatus when compared to the left shoulder.  5/5 strength with resisted external rotation and internal rotation.  No tenderness over the bicipital groove.  Negative O'Brien's.  Neurovascular intact distally.      Assessment & Plan:   Persistent right shoulder pain worrisome for rotator cuff tear  Patient's symptoms all began with an acute traumatic event.  Injury was 2 months ago and he has not responded to subacromial cortisone injection or physical therapy.  His weakness on exam has me concerned for a full-thickness supraspinatus rotator cuff tear.  We will get an MRI of his right shoulder to evaluate further.  If full-thickness tear is seen, then referral to orthopedics will be ordered.  Phone follow-up with those findings when available and we will delineate further work-up and treatment based on those results.  In the meantime, I recommended that he postpone further formal physical therapy until  after the MRI but he will need to continue with his home exercises.

## 2018-07-18 ENCOUNTER — Ambulatory Visit: Payer: 59

## 2018-07-18 MED FILL — SERTRALINE HCL 50 MG TABLET: 50 | 90 days supply | Qty: 90 | Fill #0

## 2018-07-21 ENCOUNTER — Other Ambulatory Visit: Payer: Self-pay

## 2018-07-21 ENCOUNTER — Ambulatory Visit
Admission: RE | Admit: 2018-07-21 | Discharge: 2018-07-21 | Disposition: A | Discharge status: Home / Self Care | Payer: 59 | Source: Ambulatory Visit | Attending: Sports Medicine | Admitting: Sports Medicine

## 2018-07-21 DIAGNOSIS — M25511 Pain in right shoulder: Secondary | ICD-10-CM

## 2018-07-25 ENCOUNTER — Other Ambulatory Visit: Payer: Self-pay

## 2018-07-25 ENCOUNTER — Telehealth: Payer: Self-pay | Admitting: Sports Medicine

## 2018-07-25 DIAGNOSIS — M25511 Pain in right shoulder: Secondary | ICD-10-CM

## 2018-07-25 NOTE — Telephone Encounter (Signed)
  I spoke with Benjamin Nichols on the phone yesterday after reviewing MRI findings of his right shoulder.  He has a focal 0.3 cm tear in the posterior supraspinatus which is near full-thickness.  He also has acromioclavicular osteoarthritis which results in a mass-effect on the supraspinatus.  Based on these findings I recommended consultation with Dr. Norm Salt to discuss merits of rotator cuff repair.  I will defer further work-up and treatment to the discretion of Dr.Daldorff and the patient will follow-up with me as needed.

## 2018-08-02 DIAGNOSIS — M25511 Pain in right shoulder: Secondary | ICD-10-CM | POA: Diagnosis not present

## 2018-08-07 MED FILL — MELOXICAM 15 MG TABLET: 15 | 30 days supply | Qty: 30 | Fill #1

## 2018-08-25 DIAGNOSIS — M5416 Radiculopathy, lumbar region: Secondary | ICD-10-CM | POA: Diagnosis not present

## 2018-08-25 MED FILL — predniSONE 10 MG (48) TBPK: 10 | 12 days supply | Qty: 48 | Fill #0

## 2018-08-25 MED FILL — IBUPROFEN 800 MG TAB: 800 | 30 days supply | Qty: 90 | Fill #0

## 2018-08-28 MED FILL — TAMSULOSIN HCL 0.4 MG CAP: 0.4 | 90 days supply | Qty: 90 | Fill #0

## 2018-09-08 ENCOUNTER — Ambulatory Visit
Admission: RE | Admit: 2018-09-08 | Discharge: 2018-09-08 | Disposition: A | Discharge status: Home / Self Care | Payer: 59 | Source: Ambulatory Visit | Attending: Family Medicine | Admitting: Family Medicine

## 2018-09-08 ENCOUNTER — Other Ambulatory Visit: Payer: Self-pay | Admitting: Family Medicine

## 2018-09-08 ENCOUNTER — Other Ambulatory Visit: Payer: Self-pay

## 2018-09-08 DIAGNOSIS — M545 Low back pain: Secondary | ICD-10-CM | POA: Diagnosis not present

## 2018-09-08 DIAGNOSIS — M5416 Radiculopathy, lumbar region: Secondary | ICD-10-CM

## 2018-09-11 ENCOUNTER — Other Ambulatory Visit: Payer: Self-pay | Admitting: Family Medicine

## 2018-09-11 ENCOUNTER — Other Ambulatory Visit (HOSPITAL_COMMUNITY): Payer: Self-pay | Admitting: Family Medicine

## 2018-09-11 DIAGNOSIS — M5416 Radiculopathy, lumbar region: Secondary | ICD-10-CM

## 2018-09-18 ENCOUNTER — Encounter (HOSPITAL_COMMUNITY): Payer: Self-pay

## 2018-09-18 ENCOUNTER — Ambulatory Visit (HOSPITAL_COMMUNITY): Payer: 59

## 2018-09-28 ENCOUNTER — Other Ambulatory Visit: Payer: Self-pay | Admitting: Orthopaedic Surgery

## 2018-10-04 ENCOUNTER — Ambulatory Visit
Admission: RE | Admit: 2018-10-04 | Discharge: 2018-10-04 | Disposition: A | Discharge status: Home / Self Care | Payer: 59 | Source: Ambulatory Visit | Attending: Family Medicine | Admitting: Family Medicine

## 2018-10-04 ENCOUNTER — Other Ambulatory Visit: Payer: Self-pay

## 2018-10-04 DIAGNOSIS — M48061 Spinal stenosis, lumbar region without neurogenic claudication: Secondary | ICD-10-CM | POA: Diagnosis not present

## 2018-10-04 DIAGNOSIS — M5126 Other intervertebral disc displacement, lumbar region: Secondary | ICD-10-CM | POA: Diagnosis not present

## 2018-10-04 DIAGNOSIS — M4807 Spinal stenosis, lumbosacral region: Secondary | ICD-10-CM | POA: Diagnosis not present

## 2018-10-04 DIAGNOSIS — M5416 Radiculopathy, lumbar region: Secondary | ICD-10-CM

## 2018-10-09 DIAGNOSIS — M545 Low back pain: Secondary | ICD-10-CM | POA: Diagnosis not present

## 2018-10-10 ENCOUNTER — Other Ambulatory Visit: Payer: Self-pay

## 2018-10-10 ENCOUNTER — Encounter (HOSPITAL_BASED_OUTPATIENT_CLINIC_OR_DEPARTMENT_OTHER): Payer: Self-pay

## 2018-10-10 DIAGNOSIS — M5416 Radiculopathy, lumbar region: Secondary | ICD-10-CM | POA: Diagnosis not present

## 2018-10-10 MED FILL — SERTRALINE HCL 50 MG TABS: 50 | 90 days supply | Qty: 90 | Fill #1

## 2018-10-13 ENCOUNTER — Other Ambulatory Visit (HOSPITAL_COMMUNITY)
Admission: RE | Admit: 2018-10-13 | Discharge: 2018-10-13 | Disposition: A | Discharge status: Home / Self Care | Payer: 59 | Source: Ambulatory Visit | Attending: Orthopaedic Surgery | Admitting: Orthopaedic Surgery

## 2018-10-13 DIAGNOSIS — Z1159 Encounter for screening for other viral diseases: Secondary | ICD-10-CM | POA: Insufficient documentation

## 2018-10-13 DIAGNOSIS — Z01812 Encounter for preprocedural laboratory examination: Secondary | ICD-10-CM | POA: Insufficient documentation

## 2018-10-13 NOTE — Progress Notes (Signed)
Pt picked up Ensure drink today. Given instructions to have completed by 1100 am DOS and pt. Verbalized understanding. NPO otherwise

## 2018-10-14 LAB — NOVEL CORONAVIRUS, NAA (HOSP ORDER, SEND-OUT TO REF LAB; TAT 18-24 HRS): SARS-CoV-2, NAA: NOT DETECTED

## 2018-10-16 NOTE — Anesthesia Preprocedure Evaluation (Addendum)
Anesthesia Evaluation  Patient identified by MRN, date of birth, ID band Patient awake    Reviewed: Allergy & Precautions, NPO status , Patient's Chart, lab work & pertinent test results  History of Anesthesia Complications Negative for: history of anesthetic complications  Airway Mallampati: II  TM Distance: >3 FB Neck ROM: Full    Dental no notable dental hx.    Pulmonary neg pulmonary ROS,    Pulmonary exam normal        Cardiovascular negative cardio ROS Normal cardiovascular exam     Neuro/Psych Anxiety Depression negative neurological ROS     GI/Hepatic negative GI ROS, Neg liver ROS,   Endo/Other  negative endocrine ROS  Renal/GU negative Renal ROS     Musculoskeletal  (+) Arthritis ,   Abdominal   Peds  Hematology negative hematology ROS (+)   Anesthesia Other Findings Day of surgery medications reviewed with the patient.  Reproductive/Obstetrics                            Anesthesia Physical Anesthesia Plan  ASA: II  Anesthesia Plan: General   Post-op Pain Management: GA combined w/ Regional for post-op pain   Induction: Intravenous  PONV Risk Score and Plan: 3 and Treatment may vary due to age or medical condition, Ondansetron and Dexamethasone  Airway Management Planned: Oral ETT  Additional Equipment: None  Intra-op Plan:   Post-operative Plan: Extubation in OR  Informed Consent: I have reviewed the patients History and Physical, chart, labs and discussed the procedure including the risks, benefits and alternatives for the proposed anesthesia with the patient or authorized representative who has indicated his/her understanding and acceptance.     Dental advisory given  Plan Discussed with: CRNA  Anesthesia Plan Comments:        Anesthesia Quick Evaluation

## 2018-10-16 NOTE — H&P (Signed)
Benjamin Nichols is an 63 y.o. male.   Chief Complaint:  Right Shoulder HPI:Benjamin Nichols is a 63 year old man who works Engineer, technical sales at [employer].  He is also good friends with [employer] with whom I grew up in [place].  Benjamin Nichols is here in referral through Dr. Micheline Chapman about his right dominant shoulder.  About 2-1/2 months ago he was reaching for a glass across the table and felt a sharp pain.  He eventually saw Dr. Micheline Chapman and had an injection which helped for maybe a day.  He then had ultrasound and an MRI scan which have shown a supraspinatus rotator cuff tear.  He still has pain which makes it difficult for him to rest at night and pain in certain positions especially reaching up.  The pain is intermittent and severe with a sharp quality.  He has tried some meloxicam and physical therapy to no avail.   Imaging/Tests: We took 3 views of the right shoulder here today.  He has a type IIa acromion with some AC degenerative change but no glenohumeral change.  I then reviewed an MRI scan films and report of a study done through the [facility] system on 07/21/18.  The biceps tendon looks normal.  He has a near full-thickness tear of the supraspinatus with no retraction.  There is no atrophy.  Past Medical History:  Diagnosis Date  . Anxiety   . Depression     Past Surgical History:  Procedure Laterality Date  . ACHILLES TENDON REPAIR     age 29  . VASECTOMY      History reviewed. No pertinent family history. Social History:  reports that he has never smoked. He has never used smokeless tobacco. He reports current alcohol use. He reports that he does not use drugs.  Allergies: No Known Allergies  No medications prior to admission.    No results found for this or any previous visit (from the past 48 hour(s)). No results found.  Review of Systems  Musculoskeletal: Positive for joint pain.       Right shoulder  All other systems reviewed and are negative.   Height 5\' 8"  (1.727 m), weight 83.5 kg. Physical Exam   Constitutional: He is oriented to person, place, and time. He appears well-developed and well-nourished.  HENT:  Head: Normocephalic and atraumatic.  Eyes: Pupils are equal, round, and reactive to light.  Neck: Normal range of motion.  Cardiovascular: Normal rate and regular rhythm.  Respiratory: Effort normal.  GI: Soft.  Musculoskeletal:     Comments: Right shoulder motion is basically full.  He has impingement pain in both positions and demonstrable weakness in resisted forward flexion.  He has no pain to cross chest maneuvers.  Cervical motion is full and there is no palpable lymphadenopathy.  Sensation and motor function are intact in his hands with palpable pulses on both sides.    Neurological: He is alert and oriented to person, place, and time.  Skin: Skin is warm and dry.  Psychiatric: He has a normal mood and affect. His behavior is normal. Judgment and thought content normal.     Assessment/Plan Assessment: Right shoulder rotator cuff tear with MRI 2020  Plan: Benjamin Nichols persists with pain related to a torn rotator cuff.  He has been symptomatic for almost 3 months at this point.  He does not think things are getting better despite the use of oral anti-inflammatories, an injection, and physical therapy.  He has difficulty resting and using his arm.  I have  offered him a repair. I reviewed risk of anesthesia, infection, DVT.  I also talked about a month in a sling followed by some extensive therapy.  In terms of his job he probably would miss a week or at most two as his job is mostly on a keyboard.  Larwance Sachs Kendrick Haapala, PA-C 10/16/2018, 9:49 AM

## 2018-10-17 ENCOUNTER — Encounter (HOSPITAL_BASED_OUTPATIENT_CLINIC_OR_DEPARTMENT_OTHER): Payer: Self-pay | Admitting: *Deleted

## 2018-10-17 ENCOUNTER — Ambulatory Visit (HOSPITAL_BASED_OUTPATIENT_CLINIC_OR_DEPARTMENT_OTHER): Payer: 59 | Admitting: Anesthesiology

## 2018-10-17 ENCOUNTER — Ambulatory Visit (HOSPITAL_BASED_OUTPATIENT_CLINIC_OR_DEPARTMENT_OTHER)
Admission: RE | Admit: 2018-10-17 | Discharge: 2018-10-17 | Disposition: A | Discharge status: Home / Self Care | Payer: 59 | Attending: Orthopaedic Surgery | Admitting: Orthopaedic Surgery

## 2018-10-17 ENCOUNTER — Other Ambulatory Visit: Payer: Self-pay

## 2018-10-17 ENCOUNTER — Encounter (HOSPITAL_BASED_OUTPATIENT_CLINIC_OR_DEPARTMENT_OTHER)
Admission: RE | Disposition: A | Discharge status: Home / Self Care | Payer: Self-pay | Source: Home / Self Care | Attending: Orthopaedic Surgery

## 2018-10-17 DIAGNOSIS — G8918 Other acute postprocedural pain: Secondary | ICD-10-CM | POA: Diagnosis not present

## 2018-10-17 DIAGNOSIS — M75101 Unspecified rotator cuff tear or rupture of right shoulder, not specified as traumatic: Secondary | ICD-10-CM | POA: Diagnosis not present

## 2018-10-17 DIAGNOSIS — F419 Anxiety disorder, unspecified: Secondary | ICD-10-CM | POA: Insufficient documentation

## 2018-10-17 DIAGNOSIS — M7541 Impingement syndrome of right shoulder: Secondary | ICD-10-CM | POA: Insufficient documentation

## 2018-10-17 DIAGNOSIS — M1990 Unspecified osteoarthritis, unspecified site: Secondary | ICD-10-CM | POA: Diagnosis not present

## 2018-10-17 DIAGNOSIS — X509XXA Other and unspecified overexertion or strenuous movements or postures, initial encounter: Secondary | ICD-10-CM | POA: Diagnosis not present

## 2018-10-17 DIAGNOSIS — M7551 Bursitis of right shoulder: Secondary | ICD-10-CM | POA: Diagnosis not present

## 2018-10-17 DIAGNOSIS — F329 Major depressive disorder, single episode, unspecified: Secondary | ICD-10-CM | POA: Diagnosis not present

## 2018-10-17 DIAGNOSIS — S46011A Strain of muscle(s) and tendon(s) of the rotator cuff of right shoulder, initial encounter: Secondary | ICD-10-CM | POA: Insufficient documentation

## 2018-10-17 DIAGNOSIS — M75111 Incomplete rotator cuff tear or rupture of right shoulder, not specified as traumatic: Secondary | ICD-10-CM | POA: Diagnosis not present

## 2018-10-17 HISTORY — DX: Depression, unspecified: F32.A

## 2018-10-17 HISTORY — PX: SHOULDER ACROMIOPLASTY: SHX6093

## 2018-10-17 HISTORY — DX: Anxiety disorder, unspecified: F41.9

## 2018-10-17 SURGERY — SHOULDER ACROMIOPLASTY
Anesthesia: General | Site: Shoulder | Laterality: Right

## 2018-10-17 MED ORDER — PHENYLEPHRINE 40 MCG/ML (10ML) SYRINGE FOR IV PUSH (FOR BLOOD PRESSURE SUPPORT)
PREFILLED_SYRINGE | INTRAVENOUS | Status: AC
  Filled 2018-10-17: qty 10

## 2018-10-17 MED ORDER — EPHEDRINE SULFATE-NACL 50-0.9 MG/10ML-% IV SOSY
PREFILLED_SYRINGE | INTRAVENOUS | Status: DC | PRN
  Administered 2018-10-17: 10 mg via INTRAVENOUS
  Administered 2018-10-17: 5 mg via INTRAVENOUS
  Administered 2018-10-17 (×2): 10 mg via INTRAVENOUS

## 2018-10-17 MED ORDER — MIDAZOLAM HCL 2 MG/2ML IJ SOLN
1.0000 mg | INTRAMUSCULAR | Status: DC | PRN
  Administered 2018-10-17: 14:00:00 1 mg via INTRAVENOUS

## 2018-10-17 MED ORDER — MIDAZOLAM HCL 2 MG/2ML IJ SOLN
INTRAMUSCULAR | Status: AC
  Filled 2018-10-17: qty 2

## 2018-10-17 MED ORDER — PHENYLEPHRINE HCL (PRESSORS) 10 MG/ML IV SOLN
INTRAVENOUS | Status: AC
  Filled 2018-10-17: qty 2

## 2018-10-17 MED ORDER — BUPIVACAINE LIPOSOME 1.3 % IJ SUSP
INTRAMUSCULAR | Status: DC | PRN
  Administered 2018-10-17: 10 mL via PERINEURAL

## 2018-10-17 MED ORDER — SODIUM CHLORIDE 0.9 % IR SOLN
Status: DC | PRN
  Administered 2018-10-17: 6000 mL

## 2018-10-17 MED ORDER — CHLORHEXIDINE GLUCONATE 4 % EX LIQD: 60.0000 mL | Freq: Once | CUTANEOUS | Status: DC

## 2018-10-17 MED ORDER — SODIUM CHLORIDE 0.9 % IV SOLN
INTRAVENOUS | Status: DC | PRN
  Administered 2018-10-17: 50 ug/min via INTRAVENOUS

## 2018-10-17 MED ORDER — CEFAZOLIN SODIUM-DEXTROSE 2-4 GM/100ML-% IV SOLN
2.0000 g | INTRAVENOUS | Status: AC
  Administered 2018-10-17: 15:00:00 2 g via INTRAVENOUS

## 2018-10-17 MED ORDER — BUPIVACAINE-EPINEPHRINE (PF) 0.5% -1:200000 IJ SOLN
INTRAMUSCULAR | Status: DC | PRN
  Administered 2018-10-17: 15 mL via PERINEURAL

## 2018-10-17 MED ORDER — DEXAMETHASONE SODIUM PHOSPHATE 10 MG/ML IJ SOLN
INTRAMUSCULAR | Status: DC | PRN
  Administered 2018-10-17: 5 mg via INTRAVENOUS

## 2018-10-17 MED ORDER — HYDROCODONE-ACETAMINOPHEN 5-325 MG PO TABS: 1.0000 | ORAL_TABLET | Freq: Four times a day (QID) | ORAL | 0 refills | Status: AC | PRN

## 2018-10-17 MED ORDER — LACTATED RINGERS IV SOLN
INTRAVENOUS | Status: DC
  Administered 2018-10-17 (×2): via INTRAVENOUS

## 2018-10-17 MED ORDER — LIDOCAINE 2% (20 MG/ML) 5 ML SYRINGE
INTRAMUSCULAR | Status: AC
  Filled 2018-10-17: qty 5

## 2018-10-17 MED ORDER — EPHEDRINE 5 MG/ML INJ
INTRAVENOUS | Status: AC
  Filled 2018-10-17: qty 10

## 2018-10-17 MED ORDER — PROMETHAZINE HCL 25 MG/ML IJ SOLN: 6.2500 mg | INTRAMUSCULAR | Status: DC | PRN

## 2018-10-17 MED ORDER — FENTANYL CITRATE (PF) 100 MCG/2ML IJ SOLN: 25.0000 ug | INTRAMUSCULAR | Status: DC | PRN

## 2018-10-17 MED ORDER — ACETAMINOPHEN 10 MG/ML IV SOLN: 1000.0000 mg | Freq: Once | INTRAVENOUS | Status: DC | PRN

## 2018-10-17 MED ORDER — SUGAMMADEX SODIUM 200 MG/2ML IV SOLN
INTRAVENOUS | Status: AC
  Filled 2018-10-17: qty 2

## 2018-10-17 MED ORDER — LACTATED RINGERS IV SOLN: INTRAVENOUS | Status: DC

## 2018-10-17 MED ORDER — LIDOCAINE HCL (CARDIAC) PF 100 MG/5ML IV SOSY
PREFILLED_SYRINGE | INTRAVENOUS | Status: DC | PRN
  Administered 2018-10-17: 80 mg via INTRAVENOUS

## 2018-10-17 MED ORDER — ONDANSETRON HCL 4 MG/2ML IJ SOLN
INTRAMUSCULAR | Status: DC | PRN
  Administered 2018-10-17: 4 mg via INTRAVENOUS

## 2018-10-17 MED ORDER — ONDANSETRON HCL 4 MG/2ML IJ SOLN
INTRAMUSCULAR | Status: AC
  Filled 2018-10-17: qty 2

## 2018-10-17 MED ORDER — PHENYLEPHRINE 40 MCG/ML (10ML) SYRINGE FOR IV PUSH (FOR BLOOD PRESSURE SUPPORT)
PREFILLED_SYRINGE | INTRAVENOUS | Status: DC | PRN
  Administered 2018-10-17: 80 ug via INTRAVENOUS
  Administered 2018-10-17: 100 ug via INTRAVENOUS
  Administered 2018-10-17: 80 ug via INTRAVENOUS
  Administered 2018-10-17: 40 ug via INTRAVENOUS
  Administered 2018-10-17: 100 ug via INTRAVENOUS

## 2018-10-17 MED ORDER — SCOPOLAMINE 1 MG/3DAYS TD PT72: 1.0000 | MEDICATED_PATCH | Freq: Once | TRANSDERMAL | Status: DC | PRN

## 2018-10-17 MED ORDER — ROCURONIUM BROMIDE 10 MG/ML (PF) SYRINGE
PREFILLED_SYRINGE | INTRAVENOUS | Status: AC
  Filled 2018-10-17: qty 10

## 2018-10-17 MED ORDER — FENTANYL CITRATE (PF) 100 MCG/2ML IJ SOLN
INTRAMUSCULAR | Status: AC
  Filled 2018-10-17: qty 2

## 2018-10-17 MED ORDER — OXYCODONE HCL 5 MG PO TABS: 5.0000 mg | ORAL_TABLET | Freq: Once | ORAL | Status: DC | PRN

## 2018-10-17 MED ORDER — PROPOFOL 10 MG/ML IV BOLUS
INTRAVENOUS | Status: DC | PRN
  Administered 2018-10-17: 160 mg via INTRAVENOUS

## 2018-10-17 MED ORDER — OXYCODONE HCL 5 MG/5ML PO SOLN: 5.0000 mg | Freq: Once | ORAL | Status: DC | PRN

## 2018-10-17 MED ORDER — CEFAZOLIN SODIUM-DEXTROSE 2-4 GM/100ML-% IV SOLN
INTRAVENOUS | Status: AC
  Filled 2018-10-17: qty 100

## 2018-10-17 MED ORDER — SUGAMMADEX SODIUM 200 MG/2ML IV SOLN
INTRAVENOUS | Status: DC | PRN
  Administered 2018-10-17: 340 mg via INTRAVENOUS

## 2018-10-17 MED ORDER — POVIDONE-IODINE 10 % EX SWAB
2.0000 "application " | Freq: Once | CUTANEOUS | Status: AC
  Administered 2018-10-17: 2 via TOPICAL

## 2018-10-17 MED ORDER — FENTANYL CITRATE (PF) 100 MCG/2ML IJ SOLN
50.0000 ug | INTRAMUSCULAR | Status: DC | PRN
  Administered 2018-10-17: 50 ug via INTRAVENOUS

## 2018-10-17 MED ORDER — ROCURONIUM BROMIDE 100 MG/10ML IV SOLN
INTRAVENOUS | Status: DC | PRN
  Administered 2018-10-17: 70 mg via INTRAVENOUS

## 2018-10-17 MED ORDER — DEXAMETHASONE SODIUM PHOSPHATE 10 MG/ML IJ SOLN
INTRAMUSCULAR | Status: AC
  Filled 2018-10-17: qty 1

## 2018-10-17 MED ORDER — PROPOFOL 10 MG/ML IV BOLUS
INTRAVENOUS | Status: AC
  Filled 2018-10-17: qty 20

## 2018-10-17 MED FILL — HYDROCODON-APAP 5-325: 5-325 | 3 days supply | Qty: 20 | Fill #0

## 2018-10-17 SURGICAL SUPPLY — 75 items
ADH SKN CLS APL DERMABOND .7 (GAUZE/BANDAGES/DRESSINGS)
AID PSTN UNV HD RSTRNT DISP (MISCELLANEOUS) ×2
APL SKNCLS STERI-STRIP NONHPOA (GAUZE/BANDAGES/DRESSINGS)
BENZOIN TINCTURE PRP APPL 2/3 (GAUZE/BANDAGES/DRESSINGS) IMPLANT
BLADE EXCALIBUR 4.0MM X 13CM (MISCELLANEOUS) ×1
BLADE EXCALIBUR 4.0X13 (MISCELLANEOUS) ×3 IMPLANT
BLADE SURG 15 STRL LF DISP TIS (BLADE) IMPLANT
BLADE SURG 15 STRL SS (BLADE)
BUR VERTEX HOODED 4.5 (BURR) IMPLANT
BURR OVAL 8 FLU 4.0MM X 13CM (MISCELLANEOUS) ×1
BURR OVAL 8 FLU 4.0X13 (MISCELLANEOUS) ×1 IMPLANT
CANNULA SHOULDER 7CM (CANNULA) ×6 IMPLANT
CANNULA TWIST IN 8.25X7CM (CANNULA) IMPLANT
CLOSURE WOUND 1/2 X4 (GAUZE/BANDAGES/DRESSINGS)
COVER WAND RF STERILE (DRAPES) IMPLANT
DECANTER SPIKE VIAL GLASS SM (MISCELLANEOUS) IMPLANT
DERMABOND ADVANCED (GAUZE/BANDAGES/DRESSINGS)
DERMABOND ADVANCED .7 DNX12 (GAUZE/BANDAGES/DRESSINGS) IMPLANT
DRAPE STERI 35X30 U-POUCH (DRAPES) ×4 IMPLANT
DRAPE U-SHAPE 47X51 STRL (DRAPES) ×4 IMPLANT
DRAPE U-SHAPE 76X120 STRL (DRAPES) ×8 IMPLANT
DRSG EMULSION OIL 3X3 NADH (GAUZE/BANDAGES/DRESSINGS) ×4 IMPLANT
DRSG PAD ABDOMINAL 8X10 ST (GAUZE/BANDAGES/DRESSINGS) ×4 IMPLANT
DURAPREP 26ML APPLICATOR (WOUND CARE) ×4 IMPLANT
ELECT MENISCUS 165MM 90D (ELECTRODE) IMPLANT
ELECT REM PT RETURN 9FT ADLT (ELECTROSURGICAL)
ELECTRODE REM PT RTRN 9FT ADLT (ELECTROSURGICAL) ×2 IMPLANT
GAUZE SPONGE 4X4 12PLY STRL (GAUZE/BANDAGES/DRESSINGS) ×4 IMPLANT
GLOVE BIO SURGEON STRL SZ 6.5 (GLOVE) ×1 IMPLANT
GLOVE BIO SURGEON STRL SZ8 (GLOVE) ×8 IMPLANT
GLOVE BIO SURGEONS STRL SZ 6.5 (GLOVE) ×1
GLOVE BIOGEL PI IND STRL 6.5 (GLOVE) IMPLANT
GLOVE BIOGEL PI IND STRL 8 (GLOVE) ×4 IMPLANT
GLOVE BIOGEL PI INDICATOR 6.5 (GLOVE) ×4
GLOVE BIOGEL PI INDICATOR 8 (GLOVE) ×4
GOWN STRL REUS W/ TWL LRG LVL3 (GOWN DISPOSABLE) ×2 IMPLANT
GOWN STRL REUS W/ TWL XL LVL3 (GOWN DISPOSABLE) ×4 IMPLANT
GOWN STRL REUS W/TWL LRG LVL3 (GOWN DISPOSABLE) ×4
GOWN STRL REUS W/TWL XL LVL3 (GOWN DISPOSABLE) ×12
MANIFOLD NEPTUNE II (INSTRUMENTS) ×4 IMPLANT
NDL SCORPION MULTI FIRE (NEEDLE) IMPLANT
NDL SUT 6 .5 CRC .975X.05 MAYO (NEEDLE) IMPLANT
NEEDLE MAYO TAPER (NEEDLE)
NEEDLE SCORPION MULTI FIRE (NEEDLE) IMPLANT
NS IRRIG 1000ML POUR BTL (IV SOLUTION) IMPLANT
PACK ARTHROSCOPY DSU (CUSTOM PROCEDURE TRAY) ×4 IMPLANT
PACK BASIN DAY SURGERY FS (CUSTOM PROCEDURE TRAY) ×4 IMPLANT
PASSER SUT SWANSON 36MM LOOP (INSTRUMENTS) IMPLANT
PENCIL BUTTON HOLSTER BLD 10FT (ELECTRODE) IMPLANT
PORT APPOLLO RF 90DEGREE MULTI (SURGICAL WAND) ×4 IMPLANT
RESTRAINT HEAD UNIVERSAL NS (MISCELLANEOUS) ×4 IMPLANT
SHEET MEDIUM DRAPE 40X70 STRL (DRAPES) ×4 IMPLANT
SLEEVE SCD COMPRESS KNEE MED (MISCELLANEOUS) ×2 IMPLANT
SLING ARM FOAM STRAP LRG (SOFTGOODS) ×2 IMPLANT
SLING ARM MED ADULT FOAM STRAP (SOFTGOODS) IMPLANT
SLING ARM SM FOAM STRAP (SOFTGOODS) IMPLANT
SLING ARM XL FOAM STRAP (SOFTGOODS) IMPLANT
SPONGE LAP 4X18 RFD (DISPOSABLE) IMPLANT
STRIP CLOSURE SKIN 1/2X4 (GAUZE/BANDAGES/DRESSINGS) IMPLANT
SUCTION FRAZIER HANDLE 10FR (MISCELLANEOUS)
SUCTION TUBE FRAZIER 10FR DISP (MISCELLANEOUS) IMPLANT
SUT ETHIBOND 2 OS 4 DA (SUTURE) IMPLANT
SUT ETHILON 3 0 PS 1 (SUTURE) ×4 IMPLANT
SUT FIBERWIRE #2 38 T-5 BLUE (SUTURE)
SUT PDS AB 2-0 CT2 27 (SUTURE) IMPLANT
SUT VIC AB 0 SH 27 (SUTURE) IMPLANT
SUT VIC AB 2-0 SH 27 (SUTURE)
SUT VIC AB 2-0 SH 27XBRD (SUTURE) IMPLANT
SUT VICRYL 4-0 PS2 18IN ABS (SUTURE) IMPLANT
SUTURE FIBERWR #2 38 T-5 BLUE (SUTURE) IMPLANT
SYR BULB 3OZ (MISCELLANEOUS) IMPLANT
TOWEL GREEN STERILE FF (TOWEL DISPOSABLE) ×4 IMPLANT
TUBING ARTHROSCOPY IRRIG 16FT (MISCELLANEOUS) ×4 IMPLANT
WATER STERILE IRR 1000ML POUR (IV SOLUTION) ×4 IMPLANT
YANKAUER SUCT BULB TIP NO VENT (SUCTIONS) IMPLANT

## 2018-10-17 NOTE — Anesthesia Procedure Notes (Signed)
Procedure Name: Intubation Date/Time: 10/17/2018 3:08 PM Performed by: Raenette Rover, CRNA Pre-anesthesia Checklist: Patient identified, Emergency Drugs available, Suction available and Patient being monitored Patient Re-evaluated:Patient Re-evaluated prior to induction Oxygen Delivery Method: Circle system utilized Preoxygenation: Pre-oxygenation with 100% oxygen Induction Type: IV induction Ventilation: Mask ventilation without difficulty Laryngoscope Size: Mac and 4 Grade View: Grade I Tube type: Oral Tube size: 7.5 mm Number of attempts: 1 Airway Equipment and Method: Stylet Placement Confirmation: ETT inserted through vocal cords under direct vision,  positive ETCO2,  CO2 detector and breath sounds checked- equal and bilateral Secured at: 23 cm Tube secured with: Tape Dental Injury: Teeth and Oropharynx as per pre-operative assessment

## 2018-10-17 NOTE — Progress Notes (Signed)
Assisted Dr. Howze with right, ultrasound guided, interscalene  block. Side rails up, monitors on throughout procedure. See vital signs in flow sheet. Tolerated Procedure well. °

## 2018-10-17 NOTE — Op Note (Signed)
Benjamin Nichols [id number] 10/17/2018   PRE-OP DIAGNOSIS: right sh RCT  POST-OP DIAGNOSIS: right sh partial RCT  PROCEDURE: right sh scope deb  ANESTHESIA: general and block  Hessie Dibble   Dictation #:  [id number]

## 2018-10-17 NOTE — Discharge Instructions (Signed)
Post Anesthesia Home Care Instructions  Activity: Get plenty of rest for the remainder of the day. A responsible individual must stay with you for 24 hours following the procedure.  For the next 24 hours, DO NOT: -Drive a car -Operate machinery -Drink alcoholic beverages -Take any medication unless instructed by your physician -Make any legal decisions or sign important papers.  Meals: Start with liquid foods such as gelatin or soup. Progress to regular foods as tolerated. Avoid greasy, spicy, heavy foods. If nausea and/or vomiting occur, drink only clear liquids until the nausea and/or vomiting subsides. Call your physician if vomiting continues.  Special Instructions/Symptoms: Your throat may feel dry or sore from the anesthesia or the breathing tube placed in your throat during surgery. If this causes discomfort, gargle with warm salt water. The discomfort should disappear within 24 hours.  If you had a scopolamine patch placed behind your ear for the management of post- operative nausea and/or vomiting:  1. The medication in the patch is effective for 72 hours, after which it should be removed.  Wrap patch in a tissue and discard in the trash. Wash hands thoroughly with soap and water. 2. You may remove the patch earlier than 72 hours if you experience unpleasant side effects which may include dry mouth, dizziness or visual disturbances. 3. Avoid touching the patch. Wash your hands with soap and water after contact with the patch.      Regional Anesthesia Blocks  1. Numbness or the inability to move the "blocked" extremity may last from 3-48 hours after placement. The length of time depends on the medication injected and your individual response to the medication. If the numbness is not going away after 48 hours, call your surgeon.  2. The extremity that is blocked will need to be protected until the numbness is gone and the  Strength has returned. Because you cannot feel it, you  will need to take extra care to avoid injury. Because it may be weak, you may have difficulty moving it or using it. You may not know what position it is in without looking at it while the block is in effect.  3. For blocks in the legs and feet, returning to weight bearing and walking needs to be done carefully. You will need to wait until the numbness is entirely gone and the strength has returned. You should be able to move your leg and foot normally before you try and bear weight or walk. You will need someone to be with you when you first try to ensure you do not fall and possibly risk injury.  4. Bruising and tenderness at the needle site are common side effects and will resolve in a few days.  5. Persistent numbness or new problems with movement should be communicated to the surgeon or the  Surgery Center ([phone])/  Surgery Center ([phone]).  Information for Discharge Teaching: EXPAREL (bupivacaine liposome injectable suspension)   Your surgeon or anesthesiologist gave you EXPAREL(bupivacaine) to help control your pain after surgery.   EXPAREL is a local anesthetic that provides pain relief by numbing the tissue around the surgical site.  EXPAREL is designed to release pain medication over time and can control pain for up to 72 hours.  Depending on how you respond to EXPAREL, you may require less pain medication during your recovery.  Possible side effects:  Temporary loss of sensation or ability to move in the area where bupivacaine was injected.  Nausea, vomiting, constipation  Rarely,   numbness and tingling in your mouth or lips, lightheadedness, or anxiety may occur.  Call your doctor right away if you think you may be experiencing any of these sensations, or if you have other questions regarding possible side effects.  Follow all other discharge instructions given to you by your surgeon or nurse. Eat a healthy diet and drink plenty of water or other  fluids.  If you return to the hospital for any reason within 96 hours following the administration of EXPAREL, it is important for health care providers to know that you have received this anesthetic. A teal colored band has been placed on your arm with the date, time and amount of EXPAREL you have received in order to alert and inform your health care providers. Please leave this armband in place for the full 96 hours following administration, and then you may remove the band. 

## 2018-10-17 NOTE — Op Note (Signed)
NAME: MURLIN, SCHRIEBER MEDICAL RECORD [id number] ACCOUNT [id number] DATE OF BIRTH:[birth year] FACILITY: [facility] LOCATION: [place], [place]  OPERATIVE REPORT  DATE OF PROCEDURE:  10/17/2018  PREOPERATIVE DIAGNOSES: 1.  Right shoulder impingement. 2.  Right shoulder rotator cuff tear.  POSTOPERATIVE DIAGNOSES: 1.  Right shoulder impingement. 2.  Right shoulder partial rotator cuff tear.  ANESTHESIA:  General and block.  ATTENDING SURGEON:  Melrose Nakayama, MD  ASSISTANT:  Clarene Critchley, PA  INDICATIONS:  The patient is a 63 year old man with about 4 months of pain since reaching for a glass across the table.  He has been treated with a subacromial injection which helped for a day.  MRI and ultrasound done showed a small supraspinatus  rotator cuff tear.  He has failed an exercise program as well as prednisone by mouth in addition to the injection.  He persisted with difficulty resting and using his arm.  He is offered an arthroscopy at this point.  Informed operative consent was  obtained after discussion of possible complications including reaction to anesthesia and infection.  SUMMARY OF FINDINGS AND PROCEDURE:  Under general anesthesia and a block, an arthroscopy of the right shoulder was performed.  Glenohumeral joint showed no degenerative change, and the biceps tendon looked normal.  The rotator cuff looked completely  benign from below.  In the subacromial space, he had some bursitis and partial thickness tearing of the bursal aspect.  A thorough debridement was done, but after thorough inspection of the rotator cuff, no tear worthy of repair was found.  He did have a  moderately prominent subacromial morphology addressed with an acromioplasty back to a flat surface.  He was scheduled to go home the same day.  DESCRIPTION OF PROCEDURE:  The patient was taken to the operating suite where general anesthetic was applied without difficulty.  He was also  given a block in the preanesthesia area.  He was positioned in beach-chair position and prepped and draped in  normal sterile fashion.  After the administration of preoperative IV Kefzol and appropriate time-out, an arthroscopy of the right shoulder was performed through a total of 2 portals.  Findings were as noted above.  Procedure consisted initially of the  acromioplasty done with the bur in the lateral position followed by transfer of the bur to the posterior position.  We then performed debridement of the bursal aspect cuff tear, but again no tear worthy repair was found.  The cuff was thoroughly probed.   The shoulder was thoroughly irrigated followed by removal of arthroscopic equipment.  Simple sutures and nylon were used to loosely approximate the portals followed by Adaptic, dry gauze, and tape.  ESTIMATED BLOOD LOSS AND FLUIDS:  Obtained from anesthesia records.  DISPOSITION:  The patient was extubated in the operating room and taken to recovery room in stable condition.  He wants to go home same day and follow up in the office next week.  I will contact him by phone tonight.  LN/NUANCE  D:10/17/2018 T:10/17/2018 JOB:[id number]

## 2018-10-17 NOTE — Interval H&P Note (Signed)
History and Physical Interval Note:  10/17/2018 2:26 PM  Benjamin Nichols  has presented today for surgery, with the diagnosis of Right Shoulder Impingment and Rotator Cuff Tear.  The various methods of treatment have been discussed with the patient and family. After consideration of risks, benefits and other options for treatment, the patient has consented to  Procedure(s): RIGHT SHOULDER ARTHROSCOPY WITH ROTATOR CUFF REPAIR (Right) as a surgical intervention.  The patient's history has been reviewed, patient examined, no change in status, stable for surgery.  I have reviewed the patient's chart and labs.  Questions were answered to the patient's satisfaction.     Hessie Dibble

## 2018-10-17 NOTE — Transfer of Care (Signed)
Immediate Anesthesia Transfer of Care Note  Patient: [name]  Procedure(s) Performed: shoulder arthroscopy with debridement and acromioplasty (Right Shoulder)  Patient Location: PACU  Anesthesia Type:GA combined with regional for post-op pain  Level of Consciousness: awake, alert , oriented and patient cooperative  Airway & Oxygen Therapy: Patient Spontanous Breathing and Patient connected to nasal cannula oxygen  Post-op Assessment: Report given to RN and Post -op Vital signs reviewed and stable  Post vital signs: Reviewed and stable  Last Vitals:  Vitals Value Taken Time  BP 128/74 10/17/18 1607  Temp    Pulse 91 10/17/18 1610  Resp 15 10/17/18 1610  SpO2 95 % 10/17/18 1610  Vitals shown include unvalidated device data.  Last Pain:  Vitals:   10/17/18 1445  TempSrc:   PainSc: 0-No pain         Complications: No apparent anesthesia complications

## 2018-10-17 NOTE — Anesthesia Procedure Notes (Signed)
Anesthesia Regional Block: Interscalene brachial plexus block   Pre-Anesthetic Checklist: ,, timeout performed, Correct Patient, Correct Site, Correct Laterality, Correct Procedure, Correct Position, site marked, Risks and benefits discussed, pre-op evaluation,  At surgeon's request and post-op pain management  Laterality: Right  Prep: Maximum Sterile Barrier Precautions used, chloraprep       Needles:  Injection technique: Single-shot  Needle Type: Echogenic Stimulator Needle     Needle Length: 9cm  Needle Gauge: 22     Additional Needles:   Procedures:,,,, ultrasound used (permanent image in chart),,,,  Narrative:  Start time: 10/17/2018 1:53 PM End time: 10/17/2018 1:56 PM Injection made incrementally with aspirations every 5 mL.  Performed by: Personally  Anesthesiologist: Brennan Bailey, MD  Additional Notes: Risks, benefits, and alternative discussed. Patient gave consent for procedure. Patient prepped and draped in sterile fashion. Sedation administered, patient remains easily responsive to voice. Relevant anatomy identified with ultrasound guidance. Local anesthetic given in 5cc increments with no signs or symptoms of intravascular injection. No pain or paraesthesias with injection. Patient monitored throughout procedure with signs of LAST or immediate complications. Tolerated well. Ultrasound image placed in chart.  Tawny Asal, MD

## 2018-10-17 NOTE — Anesthesia Postprocedure Evaluation (Signed)
Anesthesia Post Note  Patient: Asahd Can Lanyon  Procedure(s) Performed: shoulder arthroscopy with debridement and acromioplasty (Right Shoulder)     Patient location during evaluation: PACU Anesthesia Type: General Level of consciousness: awake and alert Pain management: pain level controlled Vital Signs Assessment: post-procedure vital signs reviewed and stable Respiratory status: spontaneous breathing, nonlabored ventilation and respiratory function stable Cardiovascular status: blood pressure returned to baseline and stable Postop Assessment: no apparent nausea or vomiting Anesthetic complications: no    Last Vitals:  Vitals:   10/17/18 1615 10/17/18 1630  BP: 130/72   Pulse: 100 84  Resp: 18 18  Temp:    SpO2: 96% 96%    Last Pain:  Vitals:   10/17/18 1630  TempSrc:   PainSc: 0-No pain                 Brennan Bailey

## 2018-10-18 ENCOUNTER — Encounter (HOSPITAL_BASED_OUTPATIENT_CLINIC_OR_DEPARTMENT_OTHER): Payer: Self-pay | Admitting: Orthopaedic Surgery

## 2018-10-30 DIAGNOSIS — Z9889 Other specified postprocedural states: Secondary | ICD-10-CM | POA: Diagnosis not present

## 2018-11-01 DIAGNOSIS — M25611 Stiffness of right shoulder, not elsewhere classified: Secondary | ICD-10-CM | POA: Diagnosis not present

## 2018-11-01 DIAGNOSIS — M6281 Muscle weakness (generalized): Secondary | ICD-10-CM | POA: Diagnosis not present

## 2018-11-08 DIAGNOSIS — M25611 Stiffness of right shoulder, not elsewhere classified: Secondary | ICD-10-CM | POA: Diagnosis not present

## 2018-11-08 DIAGNOSIS — M6281 Muscle weakness (generalized): Secondary | ICD-10-CM | POA: Diagnosis not present

## 2018-11-13 MED FILL — SHINGRIX 50 MCG SUS: 50 | 1 days supply | Qty: 1 | Fill #1

## 2018-11-16 DIAGNOSIS — M6281 Muscle weakness (generalized): Secondary | ICD-10-CM | POA: Diagnosis not present

## 2018-11-16 DIAGNOSIS — M25611 Stiffness of right shoulder, not elsewhere classified: Secondary | ICD-10-CM | POA: Diagnosis not present

## 2018-11-22 DIAGNOSIS — M25611 Stiffness of right shoulder, not elsewhere classified: Secondary | ICD-10-CM | POA: Diagnosis not present

## 2018-11-22 DIAGNOSIS — M6281 Muscle weakness (generalized): Secondary | ICD-10-CM | POA: Diagnosis not present

## 2018-11-27 MED FILL — TAMSULOSIN HCL 0.4 MG CAP: 0.4 | 90 days supply | Qty: 90 | Fill #1

## 2018-11-29 DIAGNOSIS — M25611 Stiffness of right shoulder, not elsewhere classified: Secondary | ICD-10-CM | POA: Diagnosis not present

## 2018-11-29 DIAGNOSIS — M6281 Muscle weakness (generalized): Secondary | ICD-10-CM | POA: Diagnosis not present

## 2018-12-04 DIAGNOSIS — M25611 Stiffness of right shoulder, not elsewhere classified: Secondary | ICD-10-CM | POA: Diagnosis not present

## 2018-12-04 DIAGNOSIS — M6281 Muscle weakness (generalized): Secondary | ICD-10-CM | POA: Diagnosis not present

## 2018-12-11 DIAGNOSIS — M6281 Muscle weakness (generalized): Secondary | ICD-10-CM | POA: Diagnosis not present

## 2018-12-11 DIAGNOSIS — M25611 Stiffness of right shoulder, not elsewhere classified: Secondary | ICD-10-CM | POA: Diagnosis not present

## 2018-12-14 DIAGNOSIS — M6281 Muscle weakness (generalized): Secondary | ICD-10-CM | POA: Diagnosis not present

## 2018-12-14 DIAGNOSIS — M25611 Stiffness of right shoulder, not elsewhere classified: Secondary | ICD-10-CM | POA: Diagnosis not present

## 2018-12-18 DIAGNOSIS — M25611 Stiffness of right shoulder, not elsewhere classified: Secondary | ICD-10-CM | POA: Diagnosis not present

## 2018-12-18 DIAGNOSIS — M6281 Muscle weakness (generalized): Secondary | ICD-10-CM | POA: Diagnosis not present

## 2018-12-21 DIAGNOSIS — M25611 Stiffness of right shoulder, not elsewhere classified: Secondary | ICD-10-CM | POA: Diagnosis not present

## 2018-12-21 DIAGNOSIS — M6281 Muscle weakness (generalized): Secondary | ICD-10-CM | POA: Diagnosis not present

## 2018-12-27 DIAGNOSIS — M6281 Muscle weakness (generalized): Secondary | ICD-10-CM | POA: Diagnosis not present

## 2018-12-27 DIAGNOSIS — M25611 Stiffness of right shoulder, not elsewhere classified: Secondary | ICD-10-CM | POA: Diagnosis not present

## 2019-01-03 DIAGNOSIS — M6281 Muscle weakness (generalized): Secondary | ICD-10-CM | POA: Diagnosis not present

## 2019-01-03 DIAGNOSIS — M25611 Stiffness of right shoulder, not elsewhere classified: Secondary | ICD-10-CM | POA: Diagnosis not present

## 2019-01-05 DIAGNOSIS — H3552 Pigmentary retinal dystrophy: Secondary | ICD-10-CM | POA: Diagnosis not present

## 2019-01-05 DIAGNOSIS — H53411 Scotoma involving central area, right eye: Secondary | ICD-10-CM | POA: Diagnosis not present

## 2019-01-05 DIAGNOSIS — H2513 Age-related nuclear cataract, bilateral: Secondary | ICD-10-CM | POA: Diagnosis not present

## 2019-02-27 MED FILL — SERTRALINE HCL 50 MG TABLET: 50 | 90 days supply | Qty: 90 | Fill #2

## 2019-02-27 MED FILL — TAMSULOSIN HCL 0.4 MG CAP: 0.4 | 90 days supply | Qty: 90 | Fill #2

## 2019-05-28 MED FILL — TAMSULOSIN HCL 0.4 MG CAP: 0.4 | 90 days supply | Qty: 90 | Fill #0

## 2019-05-28 MED FILL — SERTRALINE HCL 50 MG TABLET: 50 | 90 days supply | Qty: 90 | Fill #3

## 2019-06-12 DIAGNOSIS — L570 Actinic keratosis: Secondary | ICD-10-CM | POA: Diagnosis not present

## 2019-06-12 DIAGNOSIS — D225 Melanocytic nevi of trunk: Secondary | ICD-10-CM | POA: Diagnosis not present

## 2019-06-12 DIAGNOSIS — X32XXXD Exposure to sunlight, subsequent encounter: Secondary | ICD-10-CM | POA: Diagnosis not present

## 2019-06-12 DIAGNOSIS — Z1283 Encounter for screening for malignant neoplasm of skin: Secondary | ICD-10-CM | POA: Diagnosis not present

## 2019-08-14 ENCOUNTER — Other Ambulatory Visit (HOSPITAL_COMMUNITY): Payer: Self-pay | Admitting: Family Medicine

## 2019-08-14 MED FILL — SERTRALINE HCL 50 MG TABLET: 50 | 90 days supply | Qty: 90 | Fill #0

## 2019-08-21 MED FILL — TAMSULOSIN HCL 0.4 MG CAP: 0.4 | 90 days supply | Qty: 90 | Fill #1

## 2019-09-06 ENCOUNTER — Other Ambulatory Visit: Payer: Self-pay

## 2019-09-06 ENCOUNTER — Ambulatory Visit: Payer: 59 | Admitting: Sports Medicine

## 2019-09-06 VITALS — BP 116/58 | Ht 68.0 in | Wt 180.0 lb

## 2019-09-06 DIAGNOSIS — R600 Localized edema: Secondary | ICD-10-CM

## 2019-09-06 MED ORDER — PREDNISONE 10 MG PO TABS: ORAL_TABLET | ORAL | 0 refills | Status: AC

## 2019-09-06 MED FILL — predniSONE 10 MG TABS: 10 | 21 days supply | Qty: 21 | Fill #0

## 2019-09-06 NOTE — Patient Instructions (Signed)
Please go to [facility] to get your blood work done. [place]. [place] [address]  We will also have you start prednisone for 6 days. We sent that to your pharmacy. Please follow the instructions on the handout given to you today.  We will call you with the results.  Call us if you have any questions in the meantime.

## 2019-09-06 NOTE — Progress Notes (Signed)
Benjamin Nichols - 64 y.o. male MRN [id number]  Date of birth: 1956-01-16  SUBJECTIVE:   CC: bilateral heel, calf pain    64 year old male presenting with bilateral heel pain for the past 3 weeks.  He reports that 3 weeks ago he started to have pain on the plantar aspect of both heels after a 7 mile bike ride.  He describes pain over heels extending into the arch of his foot.  He stretched for about a week and this gradually improved but then he began to have pain in his calves.  He also noted swelling in bilateral ankles at the same time.  He also incidentally reports that he had bilateral knee pain at the same time but no swelling.  Knee pain is now improved and at this time, his primary pain is in his right calf and right Achilles.  He denies any recent weight changes or change in health.  To his knowledge, he has no problems with his heart, kidneys, or lungs.  He is active at baseline and walks many times a week and also rides his bike frequently.   ROS: No unexpected weight loss, fever, chills, URI symptoms, shortness of breath, chest pain, new rashes, instability, numbness/tingling, redness, otherwise see HPI   PMHx - Updated and reviewed.  Contributory factors include: Negative PSHx - Updated and reviewed.  Contributory factors include:  Negative FHx - Updated and reviewed.  Contributory factors include:  Negative Social Hx - Updated and reviewed. Contributory factors include: Negative Medications - reviewed   DATA REVIEWED: Prior records  PHYSICAL EXAM:  VS: BP:(!) 116/58  HR: bpm  TEMP: ( )  RESP:   HT:5' 8"  (172.7 cm)   WT:180 lb (81.6 kg)  BMI:27.38 PHYSICAL EXAM: Gen: NAD, alert, cooperative with exam, well-appearing HEENT: clear conjunctiva,  CV:  no edema, capillary refill brisk, normal rate Resp: non-labored Skin: no rashes, normal turgor  Neuro: no gross deficits.  Psych:  alert and oriented  Right Ankle: Inspection: Visible circumferential swelling around  aknle Palpation: no TTP along plantar fascia or calcaneus. Mild TTP along proximal achilles and soleus with palpable nodules over soleus that are tender  Range of motion is full in all directions. Strength is 5/5 in all directions. Thompsons test elicits plantarflexion of foot Able to stand on toes NVI  Left ankle: Inspection: Visible circumferential swelling around aknle Palpation: No TTP along plantar fascia, calcaneus, achilles, gastrocnemius, or soleus  Range of motion is full in all directions. Strength is 5/5 in all directions. Thompsons test elicits plantarflexion of foot Able to stand on toes NVI  Ultrasound of right lower extremity reveals normal-appearing Achilles, soleus, gastrocnemius.  Achilles is normal in appearance and thickness.  Superficial to Achilles and soleus, there is nonspecific edema.  Ankle ultrasound shows no swelling in the joint itself but rather nonspecific edema superficial to the ankle joint. Impression: Generalized edema over lower extremity.  ASSESSMENT & PLAN:   64 year old male presenting with bilateral Achilles, calf pain as well as general edema over ankles and lower extremities for the past several weeks. He denies acute injury.  Unclear etiology of swelling at this time, but given bilateral nature as well as history of bilateral knee pain, suspect this could possibly be rheumatologic rather than musculoskeletal. He does not have a history of kidney or cardiac disease. Will order a rheumatologic panel including CMP, CBC, ANA, rheumatoid factor, CRP, ESR, anti-CCP. Will prescribe a 6-day steroid course.  Return in 1 week for re-evaluation.  Patient seen and evaluated with the sports medicine fellow.  I agree with the above plan of care.  Patient has obvious soft tissue swelling diffusely around both ankles.  Etiology of swelling is unknown.  We will get blood work and start a 6-day steroid taper.  Follow-up in 1 week for reevaluation.

## 2019-09-08 LAB — COMPREHENSIVE METABOLIC PANEL
ALT: 32 IU/L (ref 0–44)
AST: 26 IU/L (ref 0–40)
Albumin/Globulin Ratio: 2 (ref 1.2–2.2)
Albumin: 4.3 g/dL (ref 3.8–4.8)
Alkaline Phosphatase: 88 IU/L (ref 39–117)
BUN/Creatinine Ratio: 23 (ref 10–24)
BUN: 21 mg/dL (ref 8–27)
Bilirubin Total: 0.2 mg/dL (ref 0.0–1.2)
CO2: 24 mmol/L (ref 20–29)
Calcium: 9.1 mg/dL (ref 8.6–10.2)
Chloride: 105 mmol/L (ref 96–106)
Creatinine, Ser: 0.92 mg/dL (ref 0.76–1.27)
GFR calc Af Amer: 103 mL/min/{1.73_m2} (ref >59)
GFR calc non Af Amer: 89 mL/min/{1.73_m2} (ref >59)
Globulin, Total: 2.2 g/dL (ref 1.5–4.5)
Glucose: 102 mg/dL — ABNORMAL HIGH (ref 65–99)
Potassium: 4.8 mmol/L (ref 3.5–5.2)
Sodium: 145 mmol/L — ABNORMAL HIGH (ref 134–144)
Total Protein: 6.5 g/dL (ref 6.0–8.5)

## 2019-09-08 LAB — CBC
Hematocrit: 36.8 % — ABNORMAL LOW (ref 37.5–51.0)
Hemoglobin: 12.1 g/dL — ABNORMAL LOW (ref 13.0–17.7)
MCH: 28.2 pg (ref 26.6–33.0)
MCHC: 32.9 g/dL (ref 31.5–35.7)
MCV: 86 fL (ref 79–97)
Platelets: 320 10*3/uL (ref 150–450)
RBC: 4.29 x10E6/uL (ref 4.14–5.80)
RDW: 13.5 % (ref 11.6–15.4)
WBC: 7.4 10*3/uL (ref 3.4–10.8)

## 2019-09-08 LAB — C-REACTIVE PROTEIN: CRP: 17 mg/L — ABNORMAL HIGH (ref 0–10)

## 2019-09-08 LAB — SEDIMENTATION RATE: Sed Rate: 12 mm/hr (ref 0–30)

## 2019-09-08 LAB — CYCLIC CITRUL PEPTIDE ANTIBODY, IGG/IGA: Cyclic Citrullin Peptide Ab: 4 units (ref 0–19)

## 2019-09-08 LAB — ANA: Anti Nuclear Antibody (ANA): NEGATIVE

## 2019-09-08 LAB — RHEUMATOID FACTOR: Rhuematoid fact SerPl-aCnc: <10 IU/mL (ref 0.0–13.9)

## 2019-09-13 ENCOUNTER — Ambulatory Visit: Payer: 59 | Admitting: Sports Medicine

## 2019-09-13 ENCOUNTER — Other Ambulatory Visit: Payer: Self-pay

## 2019-09-13 VITALS — BP 108/62 | Ht 68.0 in | Wt 180.0 lb

## 2019-09-13 DIAGNOSIS — R600 Localized edema: Secondary | ICD-10-CM | POA: Diagnosis not present

## 2019-09-14 NOTE — Progress Notes (Signed)
   Subjective:    Patient ID: Benjamin Nichols, male    DOB: 08-30-1955, 64 y.o.   MRN: [id number]  HPI  Patient comes in today for follow-up on bilateral lower extremity swelling around the ankles. Swelling has resolved after completing a 6-day Sterapred Dosepak. Improvement was almost immediate upon starting the steroids. He still has some small tender nodularities along the calf but they are improving.   Review of Systems As above    Objective:   Physical Exam  Well-developed, well-nourished. No acute distress.  Examination of both ankles shows complete resolution of his prior soft tissue swelling. Good range of motion. He still has some slightly tender nodules along his calf but they have decreased in size.  Review of his recent blood work does show a slightly elevated C-reactive protein of 17 but a normal sed rate. Incidental finding of anemia with a hemoglobin of 12.1.      Assessment & Plan:   Resolved bilateral lower extremity swelling Slight anemia  I do not have a good explanation for his recent bilateral ankle swelling. Given his improvement I think we can hold on further work-up or treatment at this time. However, if his symptoms return then I would consider referral to rheumatology to rule out possible seronegative arthropathy.  In regards to his slight anemia, he understands the importance of follow-up with his PCP. He has an appointment with him next week. Follow-up with me as needed.

## 2019-09-20 DIAGNOSIS — R972 Elevated prostate specific antigen [PSA]: Secondary | ICD-10-CM | POA: Diagnosis not present

## 2019-09-20 DIAGNOSIS — Z0000 Encounter for general adult medical examination without abnormal findings: Secondary | ICD-10-CM | POA: Diagnosis not present

## 2019-09-20 DIAGNOSIS — J309 Allergic rhinitis, unspecified: Secondary | ICD-10-CM | POA: Diagnosis not present

## 2019-09-20 DIAGNOSIS — N401 Enlarged prostate with lower urinary tract symptoms: Secondary | ICD-10-CM | POA: Diagnosis not present

## 2019-09-20 DIAGNOSIS — R7301 Impaired fasting glucose: Secondary | ICD-10-CM | POA: Diagnosis not present

## 2019-09-20 DIAGNOSIS — R7982 Elevated C-reactive protein (CRP): Secondary | ICD-10-CM | POA: Diagnosis not present

## 2019-09-20 DIAGNOSIS — L570 Actinic keratosis: Secondary | ICD-10-CM | POA: Diagnosis not present

## 2019-09-20 DIAGNOSIS — N529 Male erectile dysfunction, unspecified: Secondary | ICD-10-CM | POA: Diagnosis not present

## 2019-09-20 DIAGNOSIS — Z13220 Encounter for screening for lipoid disorders: Secondary | ICD-10-CM | POA: Diagnosis not present

## 2019-09-20 DIAGNOSIS — D649 Anemia, unspecified: Secondary | ICD-10-CM | POA: Diagnosis not present

## 2019-10-03 ENCOUNTER — Other Ambulatory Visit (HOSPITAL_COMMUNITY): Payer: Self-pay | Admitting: Urology

## 2019-10-03 DIAGNOSIS — R3915 Urgency of urination: Secondary | ICD-10-CM | POA: Diagnosis not present

## 2019-10-03 DIAGNOSIS — N401 Enlarged prostate with lower urinary tract symptoms: Secondary | ICD-10-CM | POA: Diagnosis not present

## 2019-10-03 DIAGNOSIS — R972 Elevated prostate specific antigen [PSA]: Secondary | ICD-10-CM | POA: Diagnosis not present

## 2019-10-03 MED FILL — TAMSULOSIN HCL 0.4 MG CAP: 0.4 | 90 days supply | Qty: 180 | Fill #0

## 2019-10-29 DIAGNOSIS — D649 Anemia, unspecified: Secondary | ICD-10-CM | POA: Diagnosis not present

## 2019-10-30 IMAGING — MR MRI LUMBAR SPINE WITHOUT CONTRAST
5 series · 46 of 48 positions shown · non-contrast
Comparison: Plain films lumbar spine 09/08/2018.

CLINICAL DATA: Low back pain. Left buttock and leg pain and left
leg numbness for 5 weeks.

EXAM:
MRI LUMBAR SPINE WITHOUT CONTRAST
TECHNIQUE: Multiplanar, multisequence MR imaging of the lumbar spine was
performed. No intravenous contrast was administered.

[Series 3: T2 · sagittal · 4.0mm · 0.88mm/px · 6 of 13 slices shown (1 of 2)]
[im 1/13]
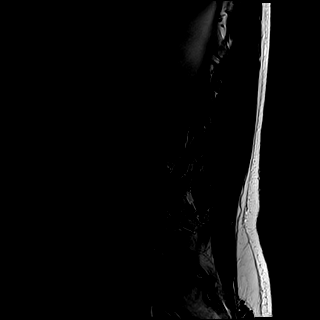
[im 3/13]
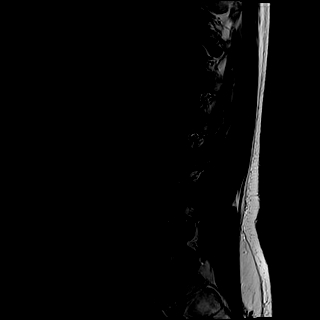
[im 5/13]
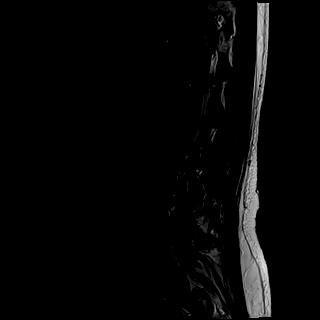
[im 8/13]
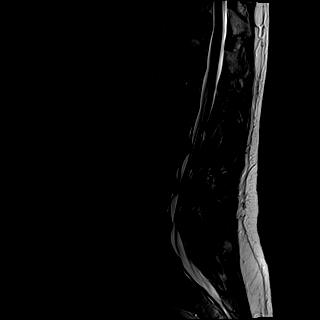
[im 10/13]
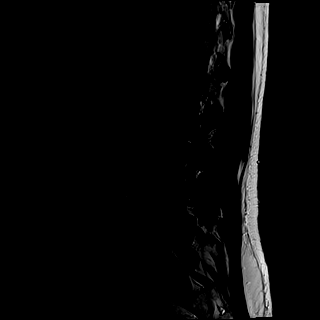
[im 13/13]
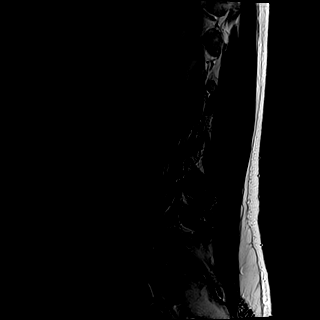

[Series 4: T1 · sagittal · 4.0mm · 0.88mm/px · 5 of 13 slices shown (1 of 2)]
[im 1/13]
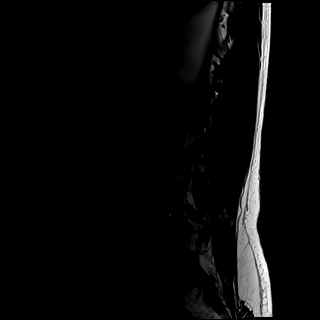
[im 4/13]
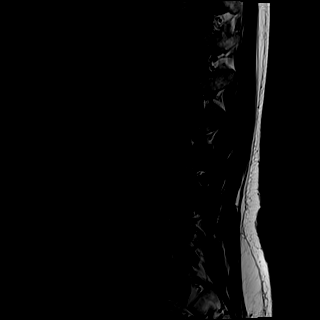
[im 7/13]
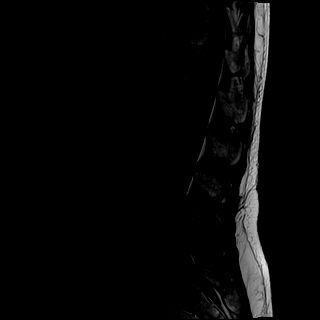
[im 10/13]
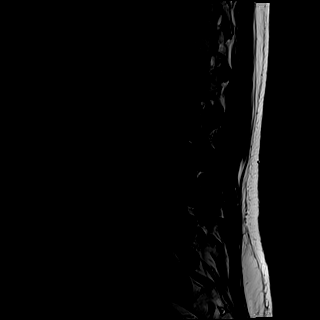
[im 13/13]
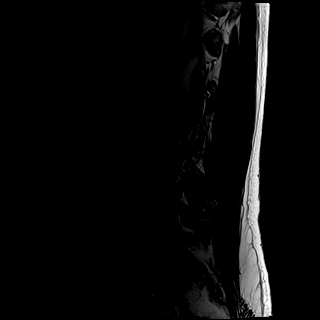

[Series 5: tirm sag · sagittal · 4.0mm · 0.55mm/px · 5 of 13 slices shown]
[im 1/13]
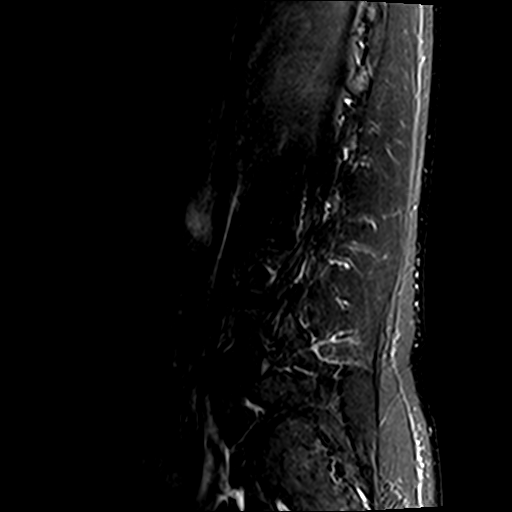
[im 4/13]
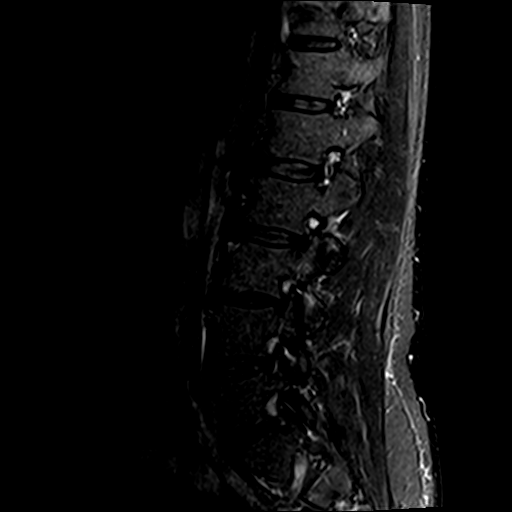
[im 7/13]
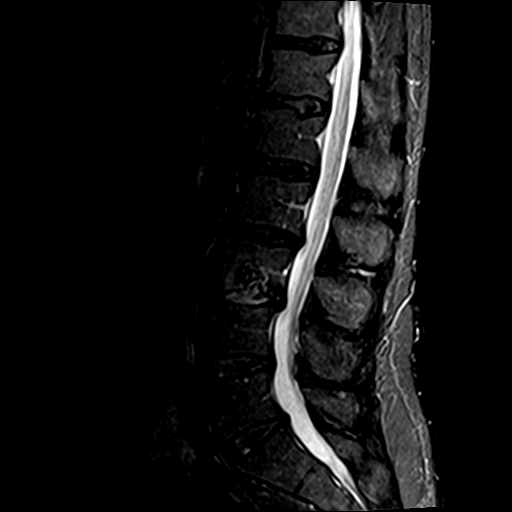
[im 10/13]
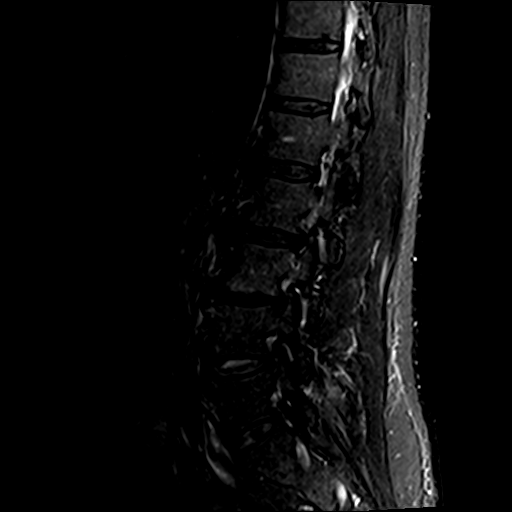
[im 13/13]
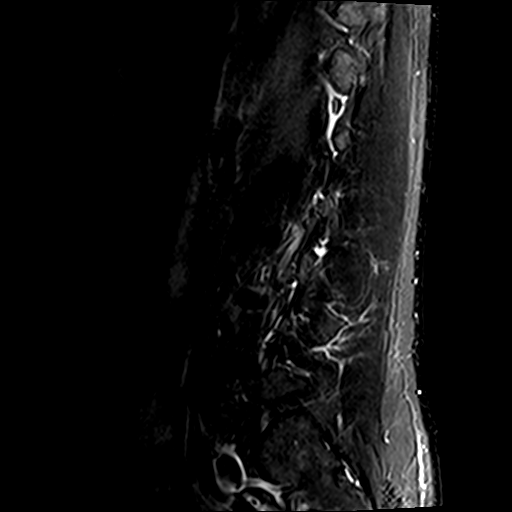

[Series 6: T1 · axial · 4.0mm · 0.78mm/px · z∈[-164,+65]mm · 14 of 41 slices shown (2 of 2)]
[im 1/41]
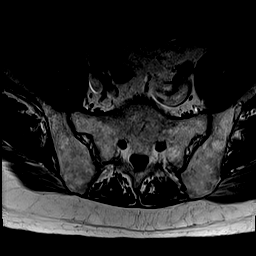
[im 3/41]
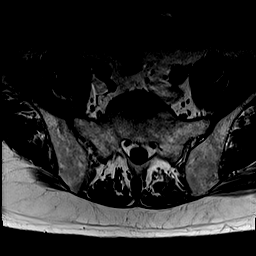
[im 6/41]
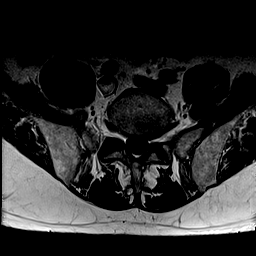
[im 9/41]
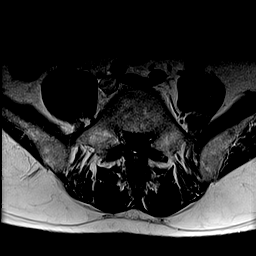
[im 11/41]
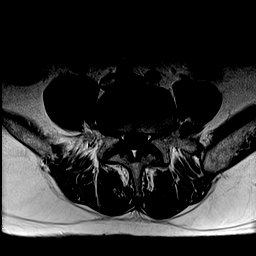
[im 14/41]
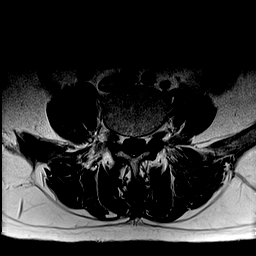
[im 17/41]
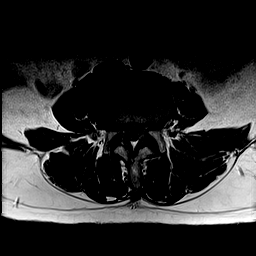
[im 19/41]
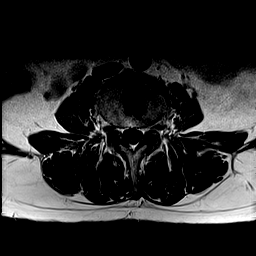
[im 22/41]
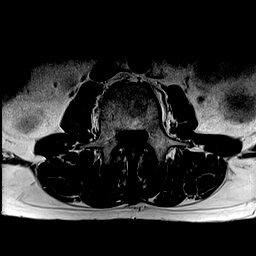
[im 25/41]
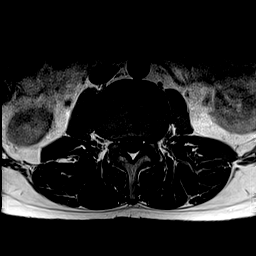
[im 27/41]
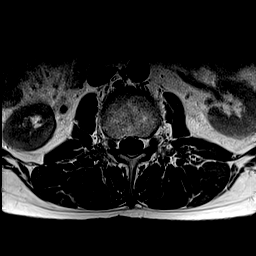
[im 30/41]
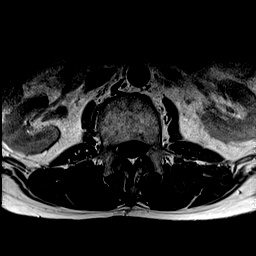
[im 35/41]
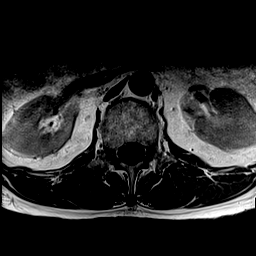
[im 41/41]
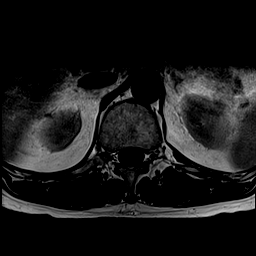

[Series 7: T2 · axial · 4.0mm · 0.78mm/px · z∈[-164,+65]mm · 16 of 41 slices shown (2 of 2)]
[im 1/41]
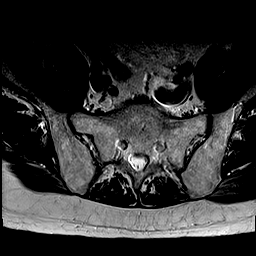
[im 3/41]
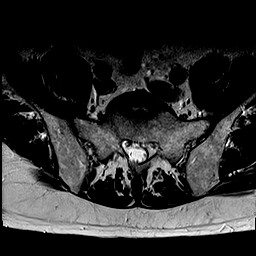
[im 6/41]
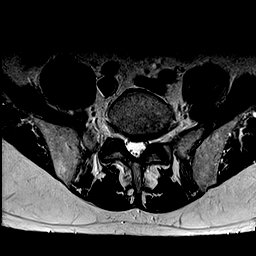
[im 9/41]
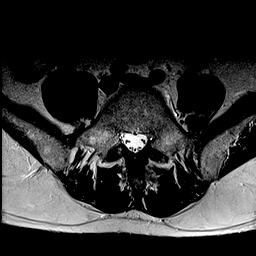
[im 11/41]
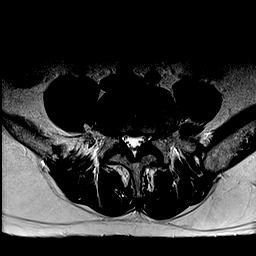
[im 14/41]
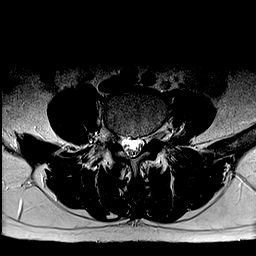
[im 17/41]
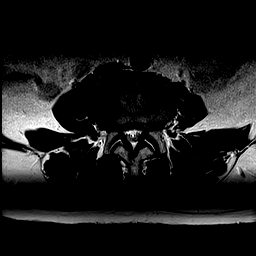
[im 19/41]
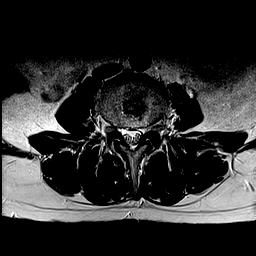
[im 22/41]
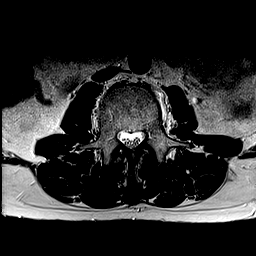
[im 25/41]
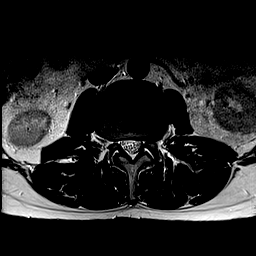
[im 27/41]
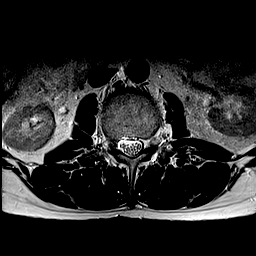
[im 30/41]
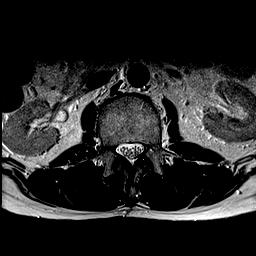
[im 33/41]
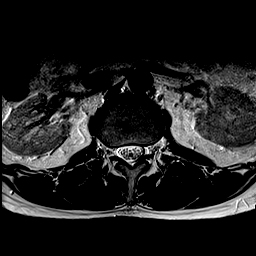
[im 35/41]
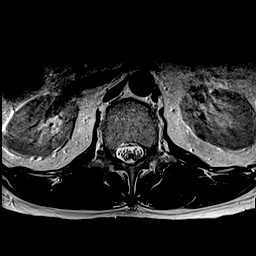
[im 38/41]
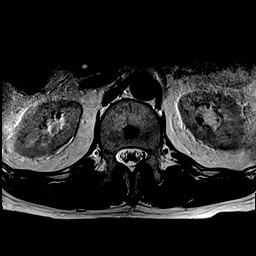
[im 41/41]
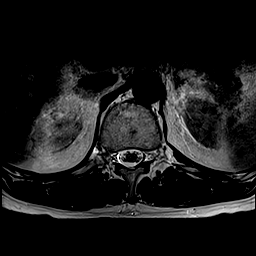

[46 of 48 positions shown; findings below may reference images not displayed]

FINDINGS: Segmentation:  Standard.

Alignment:  Trace degenerative retrolisthesis L3 on L4 is noted.

Vertebrae: No fracture or focal lesion. Scattered Schmorl's nodes
are noted, largest in the inferior endplate of L3.

Conus medullaris and cauda equina: Conus extends to the L1-2 level.
Conus and cauda equina appear normal.

Paraspinal and other soft tissues: Negative.

Disc levels:

T11-12 is imaged in the sagittal plane only and negative.

T12-L1: Negative.

L1-2: Negative.

L2-3: There is a shallow disc bulge and small left foraminal
protrusion. The central canal and right foramen are open. Mild left
foraminal narrowing noted.

L3-4: Diffuse broad-based disc bulge causes mild to moderate central
canal stenosis and some narrowing in the subarticular recesses.
Moderate to moderately severe bilateral foraminal narrowing is also
present.

L4-5: Shallow disc bulge and mild-to-moderate facet degenerative
change. There is mild central canal stenosis and left subarticular
recess narrowing. The foramina are open.

L5-S1: Shallow broad-based disc bulge causes mild narrowing in the
subarticular recesses, more notable on the left. The foramina are
open.
IMPRESSION: Mild to moderate central canal stenosis at L3-4 where there is
narrowing in the subarticular recesses due to a broad-based disc
bulge. Moderate to moderately severe bilateral foraminal narrowing
is present at this level.

Mild central canal and left subarticular recess narrowing at L4-5
due to a disc bulge to the left.

Mild narrowing in the subarticular recesses at L5-S1 is more notable
on the left.

## 2019-11-02 DIAGNOSIS — N401 Enlarged prostate with lower urinary tract symptoms: Secondary | ICD-10-CM | POA: Diagnosis not present

## 2019-11-02 DIAGNOSIS — R3915 Urgency of urination: Secondary | ICD-10-CM | POA: Diagnosis not present

## 2019-11-13 DIAGNOSIS — D509 Iron deficiency anemia, unspecified: Secondary | ICD-10-CM | POA: Diagnosis not present

## 2019-11-19 DIAGNOSIS — K2900 Acute gastritis without bleeding: Secondary | ICD-10-CM | POA: Diagnosis not present

## 2019-11-19 DIAGNOSIS — K3189 Other diseases of stomach and duodenum: Secondary | ICD-10-CM | POA: Diagnosis not present

## 2019-11-19 DIAGNOSIS — D509 Iron deficiency anemia, unspecified: Secondary | ICD-10-CM | POA: Diagnosis not present

## 2019-11-26 MED FILL — SERTRALINE HCL 50 MG TABS: 50 | 90 days supply | Qty: 90 | Fill #1

## 2019-12-25 DIAGNOSIS — R7301 Impaired fasting glucose: Secondary | ICD-10-CM | POA: Diagnosis not present

## 2019-12-25 DIAGNOSIS — D509 Iron deficiency anemia, unspecified: Secondary | ICD-10-CM | POA: Diagnosis not present

## 2020-01-08 MED FILL — PREDNISONE 10 MG (48) TBPK: 10 | 12 days supply | Qty: 48 | Fill #0

## 2020-02-04 MED FILL — TAMSULOSIN HCL 0.4 MG CAP: 0.4 | 90 days supply | Qty: 180 | Fill #1

## 2020-02-18 MED FILL — SERTRALINE HCL 50 MG TABLET: 50 | 90 days supply | Qty: 90 | Fill #2

## 2020-02-29 DIAGNOSIS — H2513 Age-related nuclear cataract, bilateral: Secondary | ICD-10-CM | POA: Diagnosis not present

## 2020-02-29 DIAGNOSIS — H3552 Pigmentary retinal dystrophy: Secondary | ICD-10-CM | POA: Diagnosis not present

## 2020-02-29 DIAGNOSIS — H53411 Scotoma involving central area, right eye: Secondary | ICD-10-CM | POA: Diagnosis not present

## 2020-04-03 DIAGNOSIS — D509 Iron deficiency anemia, unspecified: Secondary | ICD-10-CM | POA: Diagnosis not present

## 2020-04-03 DIAGNOSIS — R7301 Impaired fasting glucose: Secondary | ICD-10-CM | POA: Diagnosis not present

## 2020-05-05 MED FILL — TAMSULOSIN HCL 0.4 MG CAP: 0.4 | 90 days supply | Qty: 180 | Fill #2

## 2020-05-23 ENCOUNTER — Other Ambulatory Visit (HOSPITAL_COMMUNITY): Payer: Self-pay | Admitting: Family Medicine

## 2020-05-23 MED FILL — SERTRALINE HCL 50 MG TABS: 50 | 90 days supply | Qty: 90 | Fill #0

## 2020-06-10 DIAGNOSIS — H5213 Myopia, bilateral: Secondary | ICD-10-CM | POA: Diagnosis not present

## 2020-07-25 ENCOUNTER — Other Ambulatory Visit (HOSPITAL_BASED_OUTPATIENT_CLINIC_OR_DEPARTMENT_OTHER): Payer: Self-pay

## 2020-07-29 MED FILL — TAMSULOSIN HCL 0.4 MG CAP: 0.4 | 90 days supply | Qty: 180 | Fill #3

## 2020-08-06 ENCOUNTER — Other Ambulatory Visit (HOSPITAL_COMMUNITY): Payer: Self-pay

## 2020-08-06 DIAGNOSIS — L989 Disorder of the skin and subcutaneous tissue, unspecified: Secondary | ICD-10-CM | POA: Diagnosis not present

## 2020-08-06 MED ORDER — KETOCONAZOLE 2 % EX CREA
TOPICAL_CREAM | CUTANEOUS | 0 refills | Status: AC
  Filled 2020-08-06: qty 15, 7d supply, fill #0

## 2020-08-07 ENCOUNTER — Other Ambulatory Visit (HOSPITAL_COMMUNITY): Payer: Self-pay

## 2020-08-08 ENCOUNTER — Other Ambulatory Visit (HOSPITAL_COMMUNITY): Payer: Self-pay

## 2020-08-09 ENCOUNTER — Other Ambulatory Visit (HOSPITAL_COMMUNITY): Payer: Self-pay

## 2020-08-18 ENCOUNTER — Other Ambulatory Visit (HOSPITAL_COMMUNITY): Payer: Self-pay

## 2020-08-18 MED FILL — Sertraline HCl Tab 50 MG: ORAL | 90 days supply | Qty: 90 | Fill #0 | Status: AC

## 2020-08-21 ENCOUNTER — Other Ambulatory Visit (HOSPITAL_COMMUNITY): Payer: Self-pay

## 2020-08-28 DIAGNOSIS — J302 Other seasonal allergic rhinitis: Secondary | ICD-10-CM | POA: Diagnosis not present

## 2020-08-28 DIAGNOSIS — D509 Iron deficiency anemia, unspecified: Secondary | ICD-10-CM | POA: Diagnosis not present

## 2020-08-28 DIAGNOSIS — H353130 Nonexudative age-related macular degeneration, bilateral, stage unspecified: Secondary | ICD-10-CM | POA: Diagnosis not present

## 2020-08-28 DIAGNOSIS — R972 Elevated prostate specific antigen [PSA]: Secondary | ICD-10-CM | POA: Diagnosis not present

## 2020-08-28 DIAGNOSIS — F411 Generalized anxiety disorder: Secondary | ICD-10-CM | POA: Diagnosis not present

## 2020-08-28 DIAGNOSIS — E785 Hyperlipidemia, unspecified: Secondary | ICD-10-CM | POA: Diagnosis not present

## 2020-08-28 DIAGNOSIS — R7301 Impaired fasting glucose: Secondary | ICD-10-CM | POA: Diagnosis not present

## 2020-08-28 DIAGNOSIS — N401 Enlarged prostate with lower urinary tract symptoms: Secondary | ICD-10-CM | POA: Diagnosis not present

## 2020-09-10 ENCOUNTER — Other Ambulatory Visit (HOSPITAL_COMMUNITY): Payer: Self-pay | Admitting: Urology

## 2020-09-11 ENCOUNTER — Other Ambulatory Visit (HOSPITAL_COMMUNITY): Payer: Self-pay

## 2020-09-12 ENCOUNTER — Other Ambulatory Visit (HOSPITAL_COMMUNITY): Payer: Self-pay

## 2020-09-17 ENCOUNTER — Other Ambulatory Visit (HOSPITAL_COMMUNITY): Payer: Self-pay | Admitting: Urology

## 2020-09-17 ENCOUNTER — Other Ambulatory Visit (HOSPITAL_COMMUNITY): Payer: Self-pay

## 2020-09-17 MED ORDER — TAMSULOSIN HCL 0.4 MG PO CAPS
ORAL_CAPSULE | Freq: Two times a day (BID) | ORAL | 3 refills | Status: AC
  Filled 2020-09-17 – 2020-10-27 (×2): qty 180, 90d supply, fill #0
  Filled 2021-01-30: qty 180, 90d supply, fill #1
  Filled 2021-05-03: qty 180, 90d supply, fill #2
  Filled 2021-07-30: qty 180, 90d supply, fill #3

## 2020-10-27 ENCOUNTER — Other Ambulatory Visit (HOSPITAL_COMMUNITY): Payer: Self-pay

## 2020-10-27 MED ORDER — SERTRALINE HCL 50 MG PO TABS
ORAL_TABLET | ORAL | 0 refills | Status: DC
  Filled 2020-10-27: qty 90, 90d supply, fill #0

## 2020-10-28 ENCOUNTER — Other Ambulatory Visit (HOSPITAL_COMMUNITY): Payer: Self-pay

## 2020-11-04 ENCOUNTER — Other Ambulatory Visit (HOSPITAL_COMMUNITY): Payer: Self-pay

## 2020-12-03 DIAGNOSIS — X32XXXD Exposure to sunlight, subsequent encounter: Secondary | ICD-10-CM | POA: Diagnosis not present

## 2020-12-03 DIAGNOSIS — D225 Melanocytic nevi of trunk: Secondary | ICD-10-CM | POA: Diagnosis not present

## 2020-12-03 DIAGNOSIS — Z1283 Encounter for screening for malignant neoplasm of skin: Secondary | ICD-10-CM | POA: Diagnosis not present

## 2020-12-03 DIAGNOSIS — L570 Actinic keratosis: Secondary | ICD-10-CM | POA: Diagnosis not present

## 2020-12-17 DIAGNOSIS — H53413 Scotoma involving central area, bilateral: Secondary | ICD-10-CM | POA: Diagnosis not present

## 2020-12-22 DIAGNOSIS — Z125 Encounter for screening for malignant neoplasm of prostate: Secondary | ICD-10-CM | POA: Diagnosis not present

## 2020-12-22 DIAGNOSIS — E785 Hyperlipidemia, unspecified: Secondary | ICD-10-CM | POA: Diagnosis not present

## 2020-12-22 DIAGNOSIS — R7301 Impaired fasting glucose: Secondary | ICD-10-CM | POA: Diagnosis not present

## 2020-12-29 DIAGNOSIS — Z0000 Encounter for general adult medical examination without abnormal findings: Secondary | ICD-10-CM | POA: Diagnosis not present

## 2020-12-29 DIAGNOSIS — H353130 Nonexudative age-related macular degeneration, bilateral, stage unspecified: Secondary | ICD-10-CM | POA: Diagnosis not present

## 2020-12-29 DIAGNOSIS — E785 Hyperlipidemia, unspecified: Secondary | ICD-10-CM | POA: Diagnosis not present

## 2020-12-29 DIAGNOSIS — R7301 Impaired fasting glucose: Secondary | ICD-10-CM | POA: Diagnosis not present

## 2020-12-29 DIAGNOSIS — Z1339 Encounter for screening examination for other mental health and behavioral disorders: Secondary | ICD-10-CM | POA: Diagnosis not present

## 2020-12-29 DIAGNOSIS — J302 Other seasonal allergic rhinitis: Secondary | ICD-10-CM | POA: Diagnosis not present

## 2020-12-29 DIAGNOSIS — N401 Enlarged prostate with lower urinary tract symptoms: Secondary | ICD-10-CM | POA: Diagnosis not present

## 2020-12-29 DIAGNOSIS — F411 Generalized anxiety disorder: Secondary | ICD-10-CM | POA: Diagnosis not present

## 2020-12-29 DIAGNOSIS — Z1331 Encounter for screening for depression: Secondary | ICD-10-CM | POA: Diagnosis not present

## 2020-12-30 ENCOUNTER — Other Ambulatory Visit: Payer: Self-pay | Admitting: Internal Medicine

## 2020-12-30 DIAGNOSIS — R8290 Unspecified abnormal findings in urine: Secondary | ICD-10-CM | POA: Diagnosis not present

## 2020-12-30 DIAGNOSIS — E785 Hyperlipidemia, unspecified: Secondary | ICD-10-CM

## 2021-01-22 ENCOUNTER — Ambulatory Visit
Admission: RE | Admit: 2021-01-22 | Discharge: 2021-01-22 | Disposition: A | Discharge status: Home / Self Care | Payer: No Typology Code available for payment source | Source: Ambulatory Visit | Attending: Internal Medicine | Admitting: Internal Medicine

## 2021-01-22 DIAGNOSIS — E785 Hyperlipidemia, unspecified: Secondary | ICD-10-CM | POA: Diagnosis not present

## 2021-01-30 ENCOUNTER — Other Ambulatory Visit (HOSPITAL_COMMUNITY): Payer: Self-pay

## 2021-02-02 ENCOUNTER — Other Ambulatory Visit (HOSPITAL_COMMUNITY): Payer: Self-pay

## 2021-02-02 MED ORDER — SERTRALINE HCL 50 MG PO TABS
50.0000 mg | ORAL_TABLET | Freq: Every day | ORAL | 3 refills | Status: DC
  Filled 2021-02-02: qty 90, 90d supply, fill #0
  Filled 2021-05-03: qty 90, 90d supply, fill #1
  Filled 2021-07-30: qty 90, 90d supply, fill #2
  Filled 2021-10-26: qty 90, 90d supply, fill #3

## 2021-02-17 DIAGNOSIS — H3552 Pigmentary retinal dystrophy: Secondary | ICD-10-CM | POA: Diagnosis not present

## 2021-02-17 DIAGNOSIS — H53411 Scotoma involving central area, right eye: Secondary | ICD-10-CM | POA: Diagnosis not present

## 2021-02-17 DIAGNOSIS — H2513 Age-related nuclear cataract, bilateral: Secondary | ICD-10-CM | POA: Diagnosis not present

## 2021-04-22 ENCOUNTER — Other Ambulatory Visit: Payer: Self-pay

## 2021-05-03 ENCOUNTER — Other Ambulatory Visit (HOSPITAL_COMMUNITY): Payer: Self-pay

## 2021-05-04 ENCOUNTER — Other Ambulatory Visit (HOSPITAL_COMMUNITY): Payer: Self-pay

## 2021-05-13 ENCOUNTER — Other Ambulatory Visit (HOSPITAL_COMMUNITY): Payer: Self-pay

## 2021-05-13 MED ORDER — ALBUTEROL SULFATE HFA 108 (90 BASE) MCG/ACT IN AERS
INHALATION_SPRAY | RESPIRATORY_TRACT | 3 refills | Status: DC
  Filled 2021-05-13: qty 18, 16d supply, fill #0

## 2021-05-14 ENCOUNTER — Other Ambulatory Visit (HOSPITAL_COMMUNITY): Payer: Self-pay

## 2021-06-16 ENCOUNTER — Other Ambulatory Visit (HOSPITAL_COMMUNITY): Payer: Self-pay

## 2021-06-16 MED ORDER — CARESTART COVID-19 HOME TEST VI KIT
PACK | 0 refills | Status: AC
  Filled 2021-06-16: qty 4, 4d supply, fill #0

## 2021-06-26 DIAGNOSIS — H5213 Myopia, bilateral: Secondary | ICD-10-CM | POA: Diagnosis not present

## 2021-07-30 ENCOUNTER — Other Ambulatory Visit (HOSPITAL_COMMUNITY): Payer: Self-pay

## 2021-10-26 ENCOUNTER — Other Ambulatory Visit (HOSPITAL_COMMUNITY): Payer: Self-pay

## 2021-10-27 ENCOUNTER — Other Ambulatory Visit (HOSPITAL_COMMUNITY): Payer: Self-pay

## 2021-11-02 ENCOUNTER — Other Ambulatory Visit (HOSPITAL_COMMUNITY): Payer: Self-pay

## 2021-11-02 MED ORDER — TAMSULOSIN HCL 0.4 MG PO CAPS
ORAL_CAPSULE | ORAL | 3 refills | Status: DC
  Filled 2021-11-02: qty 180, 90d supply, fill #0
  Filled 2022-02-02: qty 180, 90d supply, fill #1
  Filled 2022-04-29 (×2): qty 180, 90d supply, fill #2
  Filled 2022-07-26: qty 180, 90d supply, fill #3

## 2021-11-14 ENCOUNTER — Other Ambulatory Visit (HOSPITAL_COMMUNITY): Payer: Self-pay

## 2021-12-24 DIAGNOSIS — D509 Iron deficiency anemia, unspecified: Secondary | ICD-10-CM | POA: Diagnosis not present

## 2021-12-24 DIAGNOSIS — R7301 Impaired fasting glucose: Secondary | ICD-10-CM | POA: Diagnosis not present

## 2021-12-24 DIAGNOSIS — Z125 Encounter for screening for malignant neoplasm of prostate: Secondary | ICD-10-CM | POA: Diagnosis not present

## 2021-12-24 DIAGNOSIS — R7989 Other specified abnormal findings of blood chemistry: Secondary | ICD-10-CM | POA: Diagnosis not present

## 2021-12-24 DIAGNOSIS — E785 Hyperlipidemia, unspecified: Secondary | ICD-10-CM | POA: Diagnosis not present

## 2021-12-24 DIAGNOSIS — F411 Generalized anxiety disorder: Secondary | ICD-10-CM | POA: Diagnosis not present

## 2021-12-31 ENCOUNTER — Other Ambulatory Visit (HOSPITAL_COMMUNITY): Payer: Self-pay

## 2021-12-31 DIAGNOSIS — Z1331 Encounter for screening for depression: Secondary | ICD-10-CM | POA: Diagnosis not present

## 2021-12-31 DIAGNOSIS — J302 Other seasonal allergic rhinitis: Secondary | ICD-10-CM | POA: Diagnosis not present

## 2021-12-31 DIAGNOSIS — H353130 Nonexudative age-related macular degeneration, bilateral, stage unspecified: Secondary | ICD-10-CM | POA: Diagnosis not present

## 2021-12-31 DIAGNOSIS — F411 Generalized anxiety disorder: Secondary | ICD-10-CM | POA: Diagnosis not present

## 2021-12-31 DIAGNOSIS — R82998 Other abnormal findings in urine: Secondary | ICD-10-CM | POA: Diagnosis not present

## 2021-12-31 DIAGNOSIS — D509 Iron deficiency anemia, unspecified: Secondary | ICD-10-CM | POA: Diagnosis not present

## 2021-12-31 DIAGNOSIS — Z0000 Encounter for general adult medical examination without abnormal findings: Secondary | ICD-10-CM | POA: Diagnosis not present

## 2021-12-31 DIAGNOSIS — R7301 Impaired fasting glucose: Secondary | ICD-10-CM | POA: Diagnosis not present

## 2021-12-31 DIAGNOSIS — E785 Hyperlipidemia, unspecified: Secondary | ICD-10-CM | POA: Diagnosis not present

## 2021-12-31 DIAGNOSIS — N401 Enlarged prostate with lower urinary tract symptoms: Secondary | ICD-10-CM | POA: Diagnosis not present

## 2021-12-31 MED ORDER — FINASTERIDE 5 MG PO TABS
ORAL_TABLET | ORAL | 11 refills | Status: DC
  Filled 2021-12-31: qty 30, 30d supply, fill #0
  Filled 2022-02-02: qty 30, 30d supply, fill #1
  Filled 2022-02-28: qty 30, 30d supply, fill #2
  Filled 2022-03-30: qty 30, 30d supply, fill #3
  Filled 2022-04-29 (×2): qty 30, 30d supply, fill #4
  Filled 2022-05-26: qty 30, 30d supply, fill #5
  Filled 2022-06-25: qty 30, 30d supply, fill #6
  Filled 2022-07-26: qty 30, 30d supply, fill #7
  Filled 2022-08-25: qty 30, 30d supply, fill #8
  Filled 2022-09-22: qty 30, 30d supply, fill #9
  Filled 2022-10-19: qty 30, 30d supply, fill #10
  Filled 2022-11-20 – 2022-11-22 (×3): qty 30, 30d supply, fill #11

## 2022-01-05 DIAGNOSIS — D225 Melanocytic nevi of trunk: Secondary | ICD-10-CM | POA: Diagnosis not present

## 2022-01-05 DIAGNOSIS — L309 Dermatitis, unspecified: Secondary | ICD-10-CM | POA: Diagnosis not present

## 2022-01-05 DIAGNOSIS — Z1283 Encounter for screening for malignant neoplasm of skin: Secondary | ICD-10-CM | POA: Diagnosis not present

## 2022-01-05 DIAGNOSIS — L308 Other specified dermatitis: Secondary | ICD-10-CM | POA: Diagnosis not present

## 2022-01-05 DIAGNOSIS — C4441 Basal cell carcinoma of skin of scalp and neck: Secondary | ICD-10-CM | POA: Diagnosis not present

## 2022-01-20 ENCOUNTER — Other Ambulatory Visit (HOSPITAL_COMMUNITY): Payer: Self-pay

## 2022-01-20 DIAGNOSIS — H01002 Unspecified blepharitis right lower eyelid: Secondary | ICD-10-CM | POA: Diagnosis not present

## 2022-01-20 DIAGNOSIS — H53413 Scotoma involving central area, bilateral: Secondary | ICD-10-CM | POA: Insufficient documentation

## 2022-01-20 DIAGNOSIS — H3589 Other specified retinal disorders: Secondary | ICD-10-CM | POA: Diagnosis not present

## 2022-01-20 DIAGNOSIS — H353133 Nonexudative age-related macular degeneration, bilateral, advanced atrophic without subfoveal involvement: Secondary | ICD-10-CM | POA: Diagnosis not present

## 2022-01-20 DIAGNOSIS — H01005 Unspecified blepharitis left lower eyelid: Secondary | ICD-10-CM | POA: Diagnosis not present

## 2022-01-20 MED ORDER — ERYTHROMYCIN 5 MG/GM OP OINT
TOPICAL_OINTMENT | Freq: Three times a day (TID) | OPHTHALMIC | 3 refills | Status: AC
  Filled 2022-01-20: qty 3.5, 10d supply, fill #0

## 2022-02-02 ENCOUNTER — Other Ambulatory Visit (HOSPITAL_COMMUNITY): Payer: Self-pay

## 2022-02-02 MED ORDER — SERTRALINE HCL 50 MG PO TABS
50.0000 mg | ORAL_TABLET | Freq: Every day | ORAL | 3 refills | Status: DC
  Filled 2022-02-02: qty 90, 90d supply, fill #0
  Filled 2022-04-29 (×2): qty 90, 90d supply, fill #1
  Filled 2022-07-26: qty 90, 90d supply, fill #2
  Filled 2022-10-20 (×2): qty 90, 90d supply, fill #3

## 2022-02-23 DIAGNOSIS — X32XXXD Exposure to sunlight, subsequent encounter: Secondary | ICD-10-CM | POA: Diagnosis not present

## 2022-02-23 DIAGNOSIS — Z85828 Personal history of other malignant neoplasm of skin: Secondary | ICD-10-CM | POA: Diagnosis not present

## 2022-02-23 DIAGNOSIS — L570 Actinic keratosis: Secondary | ICD-10-CM | POA: Diagnosis not present

## 2022-02-23 DIAGNOSIS — Z08 Encounter for follow-up examination after completed treatment for malignant neoplasm: Secondary | ICD-10-CM | POA: Diagnosis not present

## 2022-02-23 DIAGNOSIS — Z23 Encounter for immunization: Secondary | ICD-10-CM | POA: Diagnosis not present

## 2022-03-01 ENCOUNTER — Other Ambulatory Visit (HOSPITAL_COMMUNITY): Payer: Self-pay

## 2022-03-30 ENCOUNTER — Other Ambulatory Visit (HOSPITAL_COMMUNITY): Payer: Self-pay

## 2022-04-14 DIAGNOSIS — Z1589 Genetic susceptibility to other disease: Secondary | ICD-10-CM | POA: Insufficient documentation

## 2022-04-14 DIAGNOSIS — H353133 Nonexudative age-related macular degeneration, bilateral, advanced atrophic without subfoveal involvement: Secondary | ICD-10-CM | POA: Diagnosis not present

## 2022-04-14 DIAGNOSIS — H353190 Nonexudative age-related macular degeneration, unspecified eye, stage unspecified: Secondary | ICD-10-CM | POA: Diagnosis not present

## 2022-04-14 DIAGNOSIS — H53413 Scotoma involving central area, bilateral: Secondary | ICD-10-CM | POA: Diagnosis not present

## 2022-04-14 DIAGNOSIS — H3589 Other specified retinal disorders: Secondary | ICD-10-CM | POA: Diagnosis not present

## 2022-04-29 ENCOUNTER — Other Ambulatory Visit (HOSPITAL_COMMUNITY): Payer: Self-pay

## 2022-04-29 ENCOUNTER — Other Ambulatory Visit: Payer: Self-pay

## 2022-05-19 DIAGNOSIS — H353190 Nonexudative age-related macular degeneration, unspecified eye, stage unspecified: Secondary | ICD-10-CM | POA: Diagnosis not present

## 2022-05-26 ENCOUNTER — Other Ambulatory Visit (HOSPITAL_COMMUNITY): Payer: Self-pay

## 2022-05-27 ENCOUNTER — Other Ambulatory Visit (HOSPITAL_COMMUNITY): Payer: Self-pay

## 2022-06-25 ENCOUNTER — Other Ambulatory Visit (HOSPITAL_COMMUNITY): Payer: Self-pay

## 2022-06-25 ENCOUNTER — Other Ambulatory Visit: Payer: Self-pay

## 2022-07-07 DIAGNOSIS — H353190 Nonexudative age-related macular degeneration, unspecified eye, stage unspecified: Secondary | ICD-10-CM | POA: Diagnosis not present

## 2022-07-26 ENCOUNTER — Other Ambulatory Visit (HOSPITAL_COMMUNITY): Payer: Self-pay

## 2022-08-18 DIAGNOSIS — H353190 Nonexudative age-related macular degeneration, unspecified eye, stage unspecified: Secondary | ICD-10-CM | POA: Diagnosis not present

## 2022-08-25 ENCOUNTER — Other Ambulatory Visit (HOSPITAL_COMMUNITY): Payer: Self-pay

## 2022-09-08 ENCOUNTER — Other Ambulatory Visit (HOSPITAL_COMMUNITY): Payer: Self-pay

## 2022-09-08 ENCOUNTER — Ambulatory Visit: Payer: Commercial Managed Care - PPO | Admitting: Podiatry

## 2022-09-08 DIAGNOSIS — M7752 Other enthesopathy of left foot: Secondary | ICD-10-CM | POA: Diagnosis not present

## 2022-09-08 DIAGNOSIS — M2022 Hallux rigidus, left foot: Secondary | ICD-10-CM | POA: Diagnosis not present

## 2022-09-08 MED ORDER — MELOXICAM 15 MG PO TABS
15.0000 mg | ORAL_TABLET | Freq: Every day | ORAL | 0 refills | Status: DC
  Filled 2022-09-08: qty 30, 30d supply, fill #0

## 2022-09-08 NOTE — Progress Notes (Signed)
Subjective:  Patient ID: Benjamin Nichols, male    DOB: Sep 15, 1955,  MRN: [id number]  Chief Complaint  Patient presents with   Toe Pain    Left foot toe pain     67 y.o. male presents with the above complaint.  Patient presents with left first metatarsophalangeal joint pain.  Patient states that he was diagnosed with arthritis in the past.  He states that it is causing him some pain.  He wanted to discuss treatment options for it he has not seen anyone else prior to seeing me.  He denies any other acute complaints.  Hurts with ambulation hurts with pressure pain scale is 5 out of 10 dull achy in nature   Review of Systems: Negative except as noted in the HPI. Denies N/V/F/Ch.  Past Medical History:  Diagnosis Date   Anxiety    Depression     Current Outpatient Medications:    meloxicam (MOBIC) 15 MG tablet, Take 1 tablet (15 mg total) by mouth daily., Disp: 30 tablet, Rfl: 0   albuterol (VENTOLIN HFA) 108 (90 Base) MCG/ACT inhaler, Inhale 2 puffs by mouth every 4-6 hrs as needed., Disp: 18 g, Rfl: 3   Albuterol Sulfate (PROAIR RESPICLICK) 108 (90 Base) MCG/ACT AEPB, Inhale 2 puffs into the lungs every 6 (six) hours as needed (shortness of breath)., Disp: 1 each, Rfl: 2   COVID-19 At Home Antigen Test (CARESTART COVID-19 HOME TEST) KIT, use as directed within package instructions., Disp: 4 each, Rfl: 0   diphenhydrAMINE HCl (BENADRYL ALLERGY PO), Take by mouth., Disp: , Rfl:    erythromycin ophthalmic ointment, Place into both eyes 3 (three) times daily for 10 days, Disp: 3.5 g, Rfl: 3   finasteride (PROSCAR) 5 MG tablet, Take 1 tablet by mouth daily, Disp: 30 tablet, Rfl: 11   fluticasone (FLONASE) 50 MCG/ACT nasal spray, Place 2 sprays into both nostrils daily., Disp: , Rfl:    HYDROcodone-acetaminophen (NORCO) 5-325 MG tablet, Take 1-2 tablets by mouth every 6 (six) hours as needed for moderate pain., Disp: 20 tablet, Rfl: 0   ketoconazole (NIZORAL) 2 % cream, Apply to the affected  area once daily., Disp: 15 g, Rfl: 0   loratadine (CLARITIN) 10 MG tablet, Take 10 mg by mouth daily., Disp: , Rfl:    Multiple Vitamins-Minerals (PRESERVISION AREDS 2 PO), Take 1 tablet by mouth daily., Disp: , Rfl:    predniSONE (DELTASONE) 10 MG tablet, Use as directed per doctors orders., Disp: 21 tablet, Rfl: 0   Red Yeast Rice 600 MG CAPS, Take 1 capsule by mouth daily., Disp: , Rfl:    sertraline (ZOLOFT) 50 MG tablet, , Disp: , Rfl: 3   sertraline (ZOLOFT) 50 MG tablet, TAKE 1 TABLET BY MOUTH ONCE A DAY, Disp: 90 tablet, Rfl: 1   sertraline (ZOLOFT) 50 MG tablet, TAKE 1 TABLET BY MOUTH ONCE A DAY, Disp: 90 tablet, Rfl: 2   sertraline (ZOLOFT) 50 MG tablet, Take 1 tablet (50 mg total) by mouth daily., Disp: 90 tablet, Rfl: 3   tamsulosin (FLOMAX) 0.4 MG CAPS capsule, Take 1 capsule by mouth twice a day, Disp: 180 capsule, Rfl: 3   Tamsulosin HCl (FLOMAX PO), Take 1 tablet by mouth daily., Disp: , Rfl:   Social History   Tobacco Use  Smoking Status Never  Smokeless Tobacco Never    No Known Allergies Objective:  There were no vitals filed for this visit. There is no height or weight on file to calculate BMI. Constitutional Well  developed. Well nourished.  Vascular Dorsalis pedis pulses palpable bilaterally. Posterior tibial pulses palpable bilaterally. Capillary refill normal to all digits.  No cyanosis or clubbing noted. Pedal hair growth normal.  Neurologic Normal speech. Oriented to person, place, and time. Epicritic sensation to light touch grossly present bilaterally.  Dermatologic Nails well groomed and normal in appearance. No open wounds. No skin lesions.  Orthopedic: Pain on palpation to the left first metatarsophalangeal joint severe osteoarthritis noted to the first MPJ joint.  Limited range of motion noted consistent with hallux rigidus.   Radiographs: None Assessment:   1. Capsulitis of metatarsophalangeal (MTP) joint of left foot   2. Hallux rigidus of  left foot    Plan:  Patient was evaluated and treated and all questions answered.  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  Patient agrees with plan like to proceed with steroid injection -A steroid injection was performed at left first MTP using 1% plain Lidocaine and 10 mg of Kenalog. This was well tolerated.   No follow-ups on file.

## 2022-09-22 ENCOUNTER — Other Ambulatory Visit (HOSPITAL_COMMUNITY): Payer: Self-pay

## 2022-10-13 DIAGNOSIS — H353190 Nonexudative age-related macular degeneration, unspecified eye, stage unspecified: Secondary | ICD-10-CM | POA: Diagnosis not present

## 2022-10-13 DIAGNOSIS — H3553 Other dystrophies primarily involving the sensory retina: Secondary | ICD-10-CM | POA: Diagnosis not present

## 2022-10-16 ENCOUNTER — Telehealth: Payer: Commercial Managed Care - PPO | Admitting: Physician Assistant

## 2022-10-16 DIAGNOSIS — H5789 Other specified disorders of eye and adnexa: Secondary | ICD-10-CM

## 2022-10-16 NOTE — Progress Notes (Signed)
Because you recently had a treatment for you eyes , I feel your condition warrants further evaluation and I recommend that you be seen in a face to face visit.  Please contact ophthalmologist.    NOTE: There will be NO CHARGE for this eVisit   If you are having a true medical emergency please call 911.      For an urgent face to face visit, [facility] has eight urgent care centers for your convenience:   NEW!! [facility] Health Urgent Care Center at [facility] Get Driving Directions [phone] [address], [address] [place], [address]    [facility] Health Urgent Care Center at [facility] Get Driving Directions [phone] [address] [place], [place] [address]   [facility] Health Urgent Care Center [facility]) Get Driving Directions [phone] [address] [place], [place] [address]  [facility] Health Urgent Care Center [facility]) Get Driving Directions [phone] [address] [place],  [place]  [address]  [facility] Health Urgent Care Center [facility] - at [employer]  [phone] 636 262 [address] [place],  [place] [address]   [facility] Health Urgent Care at [facility] Get Driving Directions [phone] [address], [address] [place], [place] [address]   [facility] Health Urgent Care at [facility] Get Driving Directions  [phone] [address] [place], [place] [address]   [facility] Health Urgent Care at [facility] Directions [phone] [address], [address] [place], [place] [address]  Your MyChart E-visit questionnaire answers were reviewed by a board certified advanced clinical practitioner to complete your personal care plan based on your specific symptoms.  Thank you for using e-Visits.

## 2022-10-19 ENCOUNTER — Other Ambulatory Visit (HOSPITAL_COMMUNITY): Payer: Self-pay

## 2022-10-19 MED ORDER — PEG 3350-KCL-NA BICARB-NACL 420 G PO SOLR
ORAL | 0 refills | Status: AC
  Filled 2022-10-19 (×2): qty 4000, 1d supply, fill #0

## 2022-10-20 ENCOUNTER — Other Ambulatory Visit (HOSPITAL_COMMUNITY): Payer: Self-pay

## 2022-10-20 ENCOUNTER — Other Ambulatory Visit: Payer: Self-pay

## 2022-10-21 ENCOUNTER — Other Ambulatory Visit: Payer: Self-pay

## 2022-10-21 ENCOUNTER — Other Ambulatory Visit (HOSPITAL_COMMUNITY): Payer: Self-pay

## 2022-10-21 MED ORDER — TAMSULOSIN HCL 0.4 MG PO CAPS
0.4000 mg | ORAL_CAPSULE | Freq: Two times a day (BID) | ORAL | 3 refills | Status: AC
  Filled 2022-10-21: qty 180, 90d supply, fill #0
  Filled 2022-12-24 – 2022-12-28 (×2): qty 180, 90d supply, fill #1
  Filled ????-??-??: fill #1

## 2022-10-22 ENCOUNTER — Other Ambulatory Visit (HOSPITAL_COMMUNITY): Payer: Self-pay

## 2022-11-16 DIAGNOSIS — L820 Inflamed seborrheic keratosis: Secondary | ICD-10-CM | POA: Diagnosis not present

## 2022-11-16 DIAGNOSIS — Z1283 Encounter for screening for malignant neoplasm of skin: Secondary | ICD-10-CM | POA: Diagnosis not present

## 2022-11-16 DIAGNOSIS — X32XXXD Exposure to sunlight, subsequent encounter: Secondary | ICD-10-CM | POA: Diagnosis not present

## 2022-11-16 DIAGNOSIS — L570 Actinic keratosis: Secondary | ICD-10-CM | POA: Diagnosis not present

## 2022-11-16 DIAGNOSIS — D225 Melanocytic nevi of trunk: Secondary | ICD-10-CM | POA: Diagnosis not present

## 2022-11-18 DIAGNOSIS — K648 Other hemorrhoids: Secondary | ICD-10-CM | POA: Diagnosis not present

## 2022-11-18 DIAGNOSIS — Z800 Family history of malignant neoplasm of digestive organs: Secondary | ICD-10-CM | POA: Diagnosis not present

## 2022-11-18 DIAGNOSIS — Z1211 Encounter for screening for malignant neoplasm of colon: Secondary | ICD-10-CM | POA: Diagnosis not present

## 2022-11-20 ENCOUNTER — Other Ambulatory Visit (HOSPITAL_COMMUNITY): Payer: Self-pay

## 2022-11-22 ENCOUNTER — Other Ambulatory Visit: Payer: Self-pay

## 2022-11-22 ENCOUNTER — Other Ambulatory Visit (HOSPITAL_COMMUNITY): Payer: Self-pay

## 2022-12-08 DIAGNOSIS — H53413 Scotoma involving central area, bilateral: Secondary | ICD-10-CM | POA: Diagnosis not present

## 2022-12-08 DIAGNOSIS — H353190 Nonexudative age-related macular degeneration, unspecified eye, stage unspecified: Secondary | ICD-10-CM | POA: Diagnosis not present

## 2022-12-24 ENCOUNTER — Other Ambulatory Visit (HOSPITAL_COMMUNITY): Payer: Self-pay

## 2022-12-24 ENCOUNTER — Other Ambulatory Visit: Payer: Self-pay

## 2022-12-24 MED ORDER — SERTRALINE HCL 50 MG PO TABS
50.0000 mg | ORAL_TABLET | Freq: Every day | ORAL | 3 refills | Status: AC
  Filled 2022-12-24 – 2022-12-28 (×4): qty 90, 90d supply, fill #0

## 2022-12-24 MED ORDER — FINASTERIDE 5 MG PO TABS
5.0000 mg | ORAL_TABLET | Freq: Every day | ORAL | 11 refills | Status: AC
  Filled 2022-12-24: qty 30, 30d supply, fill #0

## 2022-12-24 MED ORDER — ALBUTEROL SULFATE HFA 108 (90 BASE) MCG/ACT IN AERS
2.0000 | INHALATION_SPRAY | RESPIRATORY_TRACT | 3 refills | Status: DC
  Filled 2022-12-24: qty 6.7, 17d supply, fill #0

## 2022-12-28 ENCOUNTER — Other Ambulatory Visit (HOSPITAL_COMMUNITY): Payer: Self-pay

## 2022-12-28 MED ORDER — AZITHROMYCIN 250 MG PO TABS
ORAL_TABLET | ORAL | 0 refills | Status: AC
  Filled 2022-12-28: qty 6, 5d supply, fill #0

## 2022-12-28 MED ORDER — PAXLOVID (300/100) 20 X 150 MG & 10 X 100MG PO TBPK
ORAL_TABLET | ORAL | 0 refills | Status: AC
  Filled 2022-12-28: qty 30, 5d supply, fill #0

## 2022-12-28 MED ORDER — PREDNISONE 10 MG (21) PO TBPK
ORAL_TABLET | ORAL | 0 refills | Status: AC
  Filled 2022-12-28: qty 21, 6d supply, fill #0

## 2022-12-29 ENCOUNTER — Ambulatory Visit (INDEPENDENT_AMBULATORY_CARE_PROVIDER_SITE_OTHER): Payer: Commercial Managed Care - PPO | Admitting: Podiatry

## 2022-12-29 DIAGNOSIS — M2022 Hallux rigidus, left foot: Secondary | ICD-10-CM

## 2022-12-29 DIAGNOSIS — M7752 Other enthesopathy of left foot: Secondary | ICD-10-CM

## 2022-12-29 NOTE — Progress Notes (Signed)
Subjective:  Patient ID: Benjamin Nichols, male    DOB: [birth year],  MRN: [id number]  Chief Complaint  Patient presents with   Injections    67 y.o. male presents with the above complaint.  Patient presents for follow-up of left first metatarsophalangeal joint pain/arthritis.  He states he is doing better.  The injection helped considerably.  He still noted some residual pain he would like to do another injection.   Review of Systems: Negative except as noted in the HPI. Denies N/V/F/Ch.  Past Medical History:  Diagnosis Date   Anxiety    Depression     Current Outpatient Medications:    fexofenadine (ALLEGRA) 180 MG tablet, Take by mouth., Disp: , Rfl:    acetaminophen (TYLENOL) 325 MG tablet, Take by mouth., Disp: , Rfl:    Acetylcysteine 600 MG CAPS, Take by mouth., Disp: , Rfl:    albuterol (VENTOLIN HFA) 108 (90 Base) MCG/ACT inhaler, Inhale 2 puffs into the lungs every 4 to 6 hours as needed., Disp: 6.7 g, Rfl: 3   Albuterol Sulfate (PROAIR RESPICLICK) 108 (90 Base) MCG/ACT AEPB, Inhale 2 puffs into the lungs every 6 (six) hours as needed (shortness of breath)., Disp: 1 each, Rfl: 2   azithromycin (ZITHROMAX) 250 MG tablet, Take 2 tabs the first day, then one tab once a day x 4 more days., Disp: 6 tablet, Rfl: 0   COVID-19 At Home Antigen Test (CARESTART COVID-19 HOME TEST) KIT, use as directed within package instructions., Disp: 4 each, Rfl: 0   diphenhydrAMINE HCl (BENADRYL ALLERGY PO), Take by mouth., Disp: , Rfl:    Docusate Sodium (DSS) 100 MG CAPS, Take by mouth., Disp: , Rfl:    erythromycin ophthalmic ointment, Place into both eyes 3 (three) times daily for 10 days, Disp: 3.5 g, Rfl: 3   finasteride (PROSCAR) 5 MG tablet, Take 1 tablet (5 mg total) by mouth daily., Disp: 30 tablet, Rfl: 11   fluticasone (FLONASE) 50 MCG/ACT nasal spray, Place 2 sprays into both nostrils daily., Disp: , Rfl:    HYDROcodone-acetaminophen (NORCO) 5-325 MG tablet, Take 1-2 tablets by mouth  every 6 (six) hours as needed for moderate pain., Disp: 20 tablet, Rfl: 0   ketoconazole (NIZORAL) 2 % cream, Apply to the affected area once daily., Disp: 15 g, Rfl: 0   loratadine (CLARITIN) 10 MG tablet, Take 10 mg by mouth daily., Disp: , Rfl:    melatonin 1 MG TABS tablet, Take by mouth., Disp: , Rfl:    meloxicam (MOBIC) 15 MG tablet, Take 1 tablet (15 mg total) by mouth daily., Disp: 30 tablet, Rfl: 0   Multiple Vitamins-Minerals (PRESERVISION AREDS 2 PO), Take 1 tablet by mouth daily., Disp: , Rfl:    nirmatrelvir & ritonavir (PAXLOVID, 300/100,) 20 x 150 MG & 10 x 100MG  TBPK, Take 3 tablets by mouth Twice a day 5 day(s), Disp: 30 tablet, Rfl: 0   polyethylene glycol-electrolytes (NULYTELY) 420 g solution, Take as directed., Disp: 4000 mL, Rfl: 0   predniSONE (DELTASONE) 10 MG tablet, Use as directed per doctors orders., Disp: 21 tablet, Rfl: 0   predniSONE (STERAPRED UNI-PAK 21 TAB) 10 MG (21) TBPK tablet, Take as directed on package x 6 days, Disp: 21 tablet, Rfl: 0   Red Yeast Rice 600 MG CAPS, Take 1 capsule by mouth daily., Disp: , Rfl:    sertraline (ZOLOFT) 50 MG tablet, , Disp: , Rfl: 3   sertraline (ZOLOFT) 50 MG tablet, TAKE 1 TABLET BY MOUTH ONCE  A DAY, Disp: 90 tablet, Rfl: 1   sertraline (ZOLOFT) 50 MG tablet, TAKE 1 TABLET BY MOUTH ONCE A DAY, Disp: 90 tablet, Rfl: 2   sertraline (ZOLOFT) 50 MG tablet, Take 1 tablet (50 mg total) by mouth daily., Disp: 90 tablet, Rfl: 3   tamsulosin (FLOMAX) 0.4 MG CAPS capsule, Take 1 capsule (0.4 mg total) by mouth 2 (two) times daily., Disp: 180 capsule, Rfl: 3   Tamsulosin HCl (FLOMAX PO), Take 1 tablet by mouth daily., Disp: , Rfl:   Social History   Tobacco Use  Smoking Status Never  Smokeless Tobacco Never    No Known Allergies Objective:  There were no vitals filed for this visit. There is no height or weight on file to calculate BMI. Constitutional Well developed. Well nourished.  Vascular Dorsalis pedis pulses palpable  bilaterally. Posterior tibial pulses palpable bilaterally. Capillary refill normal to all digits.  No cyanosis or clubbing noted. Pedal hair growth normal.  Neurologic Normal speech. Oriented to person, place, and time. Epicritic sensation to light touch grossly present bilaterally.  Dermatologic Nails well groomed and normal in appearance. No open wounds. No skin lesions.  Orthopedic: Pain on palpation to the left first metatarsophalangeal joint severe osteoarthritis noted to the first MPJ joint.  Limited range of motion noted consistent with hallux rigidus.   Radiographs: None Assessment:   1. Hallux rigidus of left foot   2. Capsulitis of metatarsophalangeal (MTP) joint of left foot     Plan:  Patient was evaluated and treated and all questions answered.  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  Patient agrees with plan like to proceed with steroid injection -A second steroid injection was performed at left first MTP using 1% plain Lidocaine and 10 mg of Kenalog. This was well tolerated.   No follow-ups on file.

## 2023-01-28 ENCOUNTER — Other Ambulatory Visit (HOSPITAL_COMMUNITY): Payer: Self-pay

## 2023-01-28 MED ORDER — FINASTERIDE 5 MG PO TABS
5.0000 mg | ORAL_TABLET | Freq: Every day | ORAL | 3 refills | Status: AC
  Filled 2023-01-28: qty 90, 90d supply, fill #0

## 2023-01-29 ENCOUNTER — Other Ambulatory Visit (HOSPITAL_COMMUNITY): Payer: Self-pay

## 2023-03-14 ENCOUNTER — Other Ambulatory Visit (HOSPITAL_COMMUNITY): Payer: Self-pay

## 2023-03-14 MED ORDER — COVID-19 MRNA VAC-TRIS(PFIZER) 30 MCG/0.3ML IM SUSY
0.3000 mL | PREFILLED_SYRINGE | INTRAMUSCULAR | 0 refills | Status: AC
  Filled 2023-03-14: qty 0.3, 1d supply, fill #0

## 2023-04-04 ENCOUNTER — Other Ambulatory Visit (HOSPITAL_COMMUNITY): Payer: Self-pay

## 2023-04-04 MED ORDER — AREXVY 120 MCG/0.5ML IM SUSR
0.5000 mL | INTRAMUSCULAR | 0 refills | Status: AC
  Filled 2023-04-04: qty 0.5, 1d supply, fill #0

## 2023-05-31 ENCOUNTER — Other Ambulatory Visit: Payer: Self-pay | Admitting: Family Medicine

## 2023-05-31 DIAGNOSIS — Z006 Encounter for examination for normal comparison and control in clinical research program: Secondary | ICD-10-CM

## 2023-06-16 ENCOUNTER — Other Ambulatory Visit (HOSPITAL_COMMUNITY): Payer: Self-pay

## 2023-06-17 ENCOUNTER — Ambulatory Visit
Admission: RE | Admit: 2023-06-17 | Discharge: 2023-06-17 | Disposition: A | Discharge status: Home / Self Care | Payer: No Typology Code available for payment source | Source: Ambulatory Visit | Attending: Family Medicine | Admitting: Family Medicine

## 2023-06-17 DIAGNOSIS — Z006 Encounter for examination for normal comparison and control in clinical research program: Secondary | ICD-10-CM

## 2023-06-22 ENCOUNTER — Ambulatory Visit (INDEPENDENT_AMBULATORY_CARE_PROVIDER_SITE_OTHER): Payer: Commercial Managed Care - PPO | Admitting: Podiatry

## 2023-06-22 ENCOUNTER — Ambulatory Visit: Payer: Commercial Managed Care - PPO | Admitting: Podiatry

## 2023-06-22 DIAGNOSIS — M7752 Other enthesopathy of left foot: Secondary | ICD-10-CM | POA: Diagnosis not present

## 2023-06-22 DIAGNOSIS — M2022 Hallux rigidus, left foot: Secondary | ICD-10-CM

## 2023-06-22 NOTE — Progress Notes (Signed)
 Subjective:  Patient ID: Benjamin Nichols, male    DOB: 1955-10-15,  MRN: [id number]  Chief Complaint  Patient presents with   Toe Pain    Left foot big toe pain pt stated he would like an injection     68 y.o. male presents with the above complaint.  Patient presents with left first metatarsophalangeal joint pain.  Patient states that he was diagnosed with arthritis in the past.  He states that it is causing him some pain.  He wanted to discuss treatment options for it he has not seen anyone else prior to seeing me.  He denies any other acute complaints.  Hurts with ambulation hurts with pressure pain scale is 5 out of 10 dull achy in nature   Review of Systems: Negative except as noted in the HPI. Denies N/V/F/Ch.  Past Medical History:  Diagnosis Date   Anxiety    Depression     Current Outpatient Medications:    acetaminophen (TYLENOL) 325 MG tablet, Take by mouth., Disp: , Rfl:    Acetylcysteine 600 MG CAPS, Take by mouth., Disp: , Rfl:    albuterol (VENTOLIN HFA) 108 (90 Base) MCG/ACT inhaler, Inhale 2 puffs into the lungs every 4 to 6 hours as needed., Disp: 6.7 g, Rfl: 3   Albuterol Sulfate (PROAIR RESPICLICK) 108 (90 Base) MCG/ACT AEPB, Inhale 2 puffs into the lungs every 6 (six) hours as needed (shortness of breath)., Disp: 1 each, Rfl: 2   azithromycin (ZITHROMAX) 250 MG tablet, Take 2 tabs the first day, then one tab once a day x 4 more days., Disp: 6 tablet, Rfl: 0   COVID-19 At Home Antigen Test (CARESTART COVID-19 HOME TEST) KIT, use as directed within package instructions., Disp: 4 each, Rfl: 0   diphenhydrAMINE HCl (BENADRYL ALLERGY PO), Take by mouth., Disp: , Rfl:    Docusate Sodium (DSS) 100 MG CAPS, Take by mouth., Disp: , Rfl:    erythromycin ophthalmic ointment, Place into both eyes 3 (three) times daily for 10 days, Disp: 3.5 g, Rfl: 3   fexofenadine (ALLEGRA) 180 MG tablet, Take by mouth., Disp: , Rfl:    finasteride (PROSCAR) 5 MG tablet, Take 1 tablet (5 mg  total) by mouth daily., Disp: 30 tablet, Rfl: 11   finasteride (PROSCAR) 5 MG tablet, Take 1 tablet (5 mg total) by mouth daily., Disp: 90 tablet, Rfl: 3   fluticasone (FLONASE) 50 MCG/ACT nasal spray, Place 2 sprays into both nostrils daily., Disp: , Rfl:    HYDROcodone-acetaminophen (NORCO) 5-325 MG tablet, Take 1-2 tablets by mouth every 6 (six) hours as needed for moderate pain., Disp: 20 tablet, Rfl: 0   ketoconazole (NIZORAL) 2 % cream, Apply to the affected area once daily., Disp: 15 g, Rfl: 0   loratadine (CLARITIN) 10 MG tablet, Take 10 mg by mouth daily., Disp: , Rfl:    melatonin 1 MG TABS tablet, Take by mouth., Disp: , Rfl:    meloxicam (MOBIC) 15 MG tablet, Take 1 tablet (15 mg total) by mouth daily., Disp: 30 tablet, Rfl: 0   Multiple Vitamins-Minerals (PRESERVISION AREDS 2 PO), Take 1 tablet by mouth daily., Disp: , Rfl:    nirmatrelvir & ritonavir (PAXLOVID, 300/100,) 20 x 150 MG & 10 x 100MG  TBPK, Take 3 tablets by mouth Twice a day 5 day(s), Disp: 30 tablet, Rfl: 0   polyethylene glycol-electrolytes (NULYTELY) 420 g solution, Take as directed., Disp: 4000 mL, Rfl: 0   predniSONE (DELTASONE) 10 MG tablet, Use as directed  per doctors orders., Disp: 21 tablet, Rfl: 0   predniSONE (STERAPRED UNI-PAK 21 TAB) 10 MG (21) TBPK tablet, Take as directed on package x 6 days, Disp: 21 tablet, Rfl: 0   Red Yeast Rice 600 MG CAPS, Take 1 capsule by mouth daily., Disp: , Rfl:    sertraline (ZOLOFT) 50 MG tablet, , Disp: , Rfl: 3   sertraline (ZOLOFT) 50 MG tablet, TAKE 1 TABLET BY MOUTH ONCE A DAY, Disp: 90 tablet, Rfl: 1   sertraline (ZOLOFT) 50 MG tablet, TAKE 1 TABLET BY MOUTH ONCE A DAY, Disp: 90 tablet, Rfl: 2   sertraline (ZOLOFT) 50 MG tablet, Take 1 tablet (50 mg total) by mouth daily., Disp: 90 tablet, Rfl: 3   tamsulosin (FLOMAX) 0.4 MG CAPS capsule, Take 1 capsule (0.4 mg total) by mouth 2 (two) times daily., Disp: 180 capsule, Rfl: 3   Tamsulosin HCl (FLOMAX PO), Take 1 tablet by  mouth daily., Disp: , Rfl:   Social History   Tobacco Use  Smoking Status Never  Smokeless Tobacco Never    No Known Allergies Objective:  There were no vitals filed for this visit. There is no height or weight on file to calculate BMI. Constitutional Well developed. Well nourished.  Vascular Dorsalis pedis pulses palpable bilaterally. Posterior tibial pulses palpable bilaterally. Capillary refill normal to all digits.  No cyanosis or clubbing noted. Pedal hair growth normal.  Neurologic Normal speech. Oriented to person, place, and time. Epicritic sensation to light touch grossly present bilaterally.  Dermatologic Nails well groomed and normal in appearance. No open wounds. No skin lesions.  Orthopedic: Pain on palpation to the left first metatarsophalangeal joint severe osteoarthritis noted to the first MPJ joint.  Limited range of motion noted consistent with hallux rigidus.   Radiographs: None Assessment:   No diagnosis found.  Plan:  Patient was evaluated and treated and all questions answered.  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  Patient agrees with plan like to proceed with steroid injection -A steroid injection was performed at left first MTP using 1% plain Lidocaine and 10 mg of Kenalog. This was well tolerated.   No follow-ups on file.

## 2023-10-17 ENCOUNTER — Other Ambulatory Visit: Payer: Self-pay

## 2023-10-17 ENCOUNTER — Other Ambulatory Visit: Payer: Self-pay | Admitting: Podiatry

## 2023-10-17 ENCOUNTER — Other Ambulatory Visit (HOSPITAL_COMMUNITY): Payer: Self-pay

## 2023-10-17 MED ORDER — MELOXICAM 15 MG PO TABS
15.0000 mg | ORAL_TABLET | Freq: Every day | ORAL | 0 refills | Status: AC
  Filled 2023-10-17: qty 30, 30d supply, fill #0

## 2024-02-08 ENCOUNTER — Other Ambulatory Visit (HOSPITAL_BASED_OUTPATIENT_CLINIC_OR_DEPARTMENT_OTHER): Payer: Self-pay

## 2024-02-08 MED ORDER — FLUZONE HIGH-DOSE 0.5 ML IM SUSY
0.5000 mL | PREFILLED_SYRINGE | Freq: Once | INTRAMUSCULAR | 0 refills | Status: AC
  Filled 2024-02-08: qty 0.5, 1d supply, fill #0

## 2024-02-28 ENCOUNTER — Other Ambulatory Visit (HOSPITAL_BASED_OUTPATIENT_CLINIC_OR_DEPARTMENT_OTHER): Payer: Self-pay

## 2024-02-28 MED ORDER — COMIRNATY 30 MCG/0.3ML IM SUSY
0.3000 mL | PREFILLED_SYRINGE | Freq: Once | INTRAMUSCULAR | 0 refills | Status: AC
  Filled 2024-02-28: qty 0.3, 1d supply, fill #0

## 2024-03-06 ENCOUNTER — Other Ambulatory Visit (HOSPITAL_COMMUNITY): Payer: Self-pay

## 2024-03-15 ENCOUNTER — Other Ambulatory Visit (HOSPITAL_COMMUNITY): Payer: Self-pay

## 2024-03-15 MED ORDER — OXYCODONE HCL 5 MG PO TABS
5.0000 mg | ORAL_TABLET | ORAL | 0 refills | Status: AC | PRN
  Filled 2024-03-15: qty 30, 5d supply, fill #0

## 2024-03-21 ENCOUNTER — Other Ambulatory Visit (HOSPITAL_COMMUNITY): Payer: Self-pay

## 2024-03-21 MED ORDER — CEFADROXIL 500 MG PO CAPS
500.0000 mg | ORAL_CAPSULE | Freq: Two times a day (BID) | ORAL | 0 refills | Status: AC
  Filled 2024-03-21: qty 10, 5d supply, fill #0

## 2024-04-02 ENCOUNTER — Other Ambulatory Visit (HOSPITAL_COMMUNITY): Payer: Self-pay

## 2024-04-03 ENCOUNTER — Other Ambulatory Visit: Payer: Self-pay

## 2024-04-03 ENCOUNTER — Other Ambulatory Visit (HOSPITAL_COMMUNITY): Payer: Self-pay

## 2024-04-03 MED ORDER — ALBUTEROL SULFATE HFA 108 (90 BASE) MCG/ACT IN AERS
2.0000 | INHALATION_SPRAY | RESPIRATORY_TRACT | 3 refills | Status: AC | PRN
  Filled 2024-04-03: qty 6.7, 17d supply, fill #0
